# Patient Record
Sex: Female | Born: 1955 | Race: White | Hispanic: No | Marital: Married | Smoking: Former smoker
Health system: Southern US, Community
[De-identification: ages and names within clinical notes are randomized; demographics above are authoritative.]

## PROBLEM LIST (undated history)

## (undated) ENCOUNTER — Ambulatory Visit: Discharge status: Absent without leave | Payer: Medicare PPO

## (undated) DIAGNOSIS — R55 Syncope and collapse: Secondary | ICD-10-CM

## (undated) DIAGNOSIS — N6009 Solitary cyst of unspecified breast: Secondary | ICD-10-CM

## (undated) DIAGNOSIS — E079 Disorder of thyroid, unspecified: Secondary | ICD-10-CM

## (undated) HISTORY — PX: ABDOMINAL HYSTERECTOMY: SHX81

## (undated) HISTORY — PX: TUBAL LIGATION: SHX77

## (undated) HISTORY — DX: Syncope and collapse: R55

## (undated) HISTORY — PX: ETHMOIDECTOMY: SHX5197

## (undated) HISTORY — DX: Solitary cyst of unspecified breast: N60.09

## (undated) HISTORY — DX: Disorder of thyroid, unspecified: E07.9

## (undated) HISTORY — PX: WISDOM TOOTH EXTRACTION: SHX21

## (undated) HISTORY — PX: HAND SURGERY: SHX662

---
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
## 1999-08-19 ENCOUNTER — Other Ambulatory Visit: Admission: RE | Admit: 1999-08-19 | Discharge: 1999-08-19 | Payer: Self-pay | Admitting: Gynecology

## 2000-08-22 ENCOUNTER — Other Ambulatory Visit: Admission: RE | Admit: 2000-08-22 | Discharge: 2000-08-22 | Payer: Self-pay | Admitting: Gynecology

## 2000-08-25 ENCOUNTER — Encounter: Admission: RE | Admit: 2000-08-25 | Discharge: 2000-08-25 | Payer: Self-pay | Admitting: Gynecology

## 2000-08-25 ENCOUNTER — Encounter: Payer: Self-pay | Admitting: Gynecology

## 2000-09-01 ENCOUNTER — Encounter: Admission: RE | Admit: 2000-09-01 | Discharge: 2000-09-01 | Payer: Self-pay | Admitting: Gynecology

## 2000-09-01 ENCOUNTER — Encounter: Payer: Self-pay | Admitting: Gynecology

## 2001-02-15 ENCOUNTER — Ambulatory Visit (HOSPITAL_COMMUNITY): Admission: RE | Admit: 2001-02-15 | Discharge: 2001-02-15 | Payer: Self-pay | Admitting: Gastroenterology

## 2001-08-23 ENCOUNTER — Other Ambulatory Visit: Admission: RE | Admit: 2001-08-23 | Discharge: 2001-08-23 | Payer: Self-pay | Admitting: Gynecology

## 2001-09-07 ENCOUNTER — Encounter: Payer: Self-pay | Admitting: Gynecology

## 2001-09-07 ENCOUNTER — Encounter: Admission: RE | Admit: 2001-09-07 | Discharge: 2001-09-07 | Payer: Self-pay | Admitting: Gynecology

## 2002-06-14 ENCOUNTER — Encounter: Payer: Self-pay | Admitting: Family Medicine

## 2002-06-14 ENCOUNTER — Ambulatory Visit (HOSPITAL_COMMUNITY): Admission: RE | Admit: 2002-06-14 | Discharge: 2002-06-14 | Payer: Self-pay | Admitting: Family Medicine

## 2002-06-19 ENCOUNTER — Ambulatory Visit (HOSPITAL_COMMUNITY): Admission: RE | Admit: 2002-06-19 | Discharge: 2002-06-19 | Payer: Self-pay | Admitting: Family Medicine

## 2002-06-19 ENCOUNTER — Encounter: Payer: Self-pay | Admitting: Family Medicine

## 2002-09-09 ENCOUNTER — Encounter: Payer: Self-pay | Admitting: Gynecology

## 2002-09-09 ENCOUNTER — Encounter: Admission: RE | Admit: 2002-09-09 | Discharge: 2002-09-09 | Payer: Self-pay | Admitting: Gynecology

## 2002-11-04 ENCOUNTER — Ambulatory Visit (HOSPITAL_COMMUNITY): Admission: RE | Admit: 2002-11-04 | Discharge: 2002-11-04 | Payer: Self-pay | Admitting: Family Medicine

## 2002-11-04 ENCOUNTER — Encounter: Payer: Self-pay | Admitting: Family Medicine

## 2003-02-26 ENCOUNTER — Other Ambulatory Visit: Admission: RE | Admit: 2003-02-26 | Discharge: 2003-02-26 | Payer: Self-pay | Admitting: Gynecology

## 2004-03-26 ENCOUNTER — Ambulatory Visit (HOSPITAL_COMMUNITY): Admission: RE | Admit: 2004-03-26 | Discharge: 2004-03-26 | Payer: Self-pay | Admitting: Orthopedic Surgery

## 2004-03-26 ENCOUNTER — Encounter: Payer: Self-pay | Admitting: Cardiology

## 2004-06-17 ENCOUNTER — Other Ambulatory Visit: Admission: RE | Admit: 2004-06-17 | Discharge: 2004-06-17 | Payer: Self-pay | Admitting: Gynecology

## 2004-06-20 ENCOUNTER — Emergency Department (HOSPITAL_COMMUNITY): Admission: EM | Admit: 2004-06-20 | Discharge: 2004-06-20 | Payer: Self-pay | Admitting: *Deleted

## 2004-11-01 ENCOUNTER — Encounter: Admission: RE | Admit: 2004-11-01 | Discharge: 2004-11-01 | Payer: Self-pay | Admitting: Gynecology

## 2005-03-01 ENCOUNTER — Encounter: Admission: RE | Admit: 2005-03-01 | Discharge: 2005-03-01 | Payer: Self-pay | Admitting: Gynecology

## 2005-12-08 ENCOUNTER — Other Ambulatory Visit: Admission: RE | Admit: 2005-12-08 | Discharge: 2005-12-08 | Payer: Self-pay | Admitting: Gynecology

## 2006-05-03 ENCOUNTER — Encounter: Admission: RE | Admit: 2006-05-03 | Discharge: 2006-05-03 | Payer: Self-pay | Admitting: Gynecology

## 2007-05-07 ENCOUNTER — Other Ambulatory Visit: Admission: RE | Admit: 2007-05-07 | Discharge: 2007-05-07 | Payer: Self-pay | Admitting: Gynecology

## 2008-02-26 ENCOUNTER — Encounter: Admission: RE | Admit: 2008-02-26 | Discharge: 2008-02-26 | Payer: Self-pay | Admitting: Gynecology

## 2011-04-28 ENCOUNTER — Other Ambulatory Visit: Payer: Self-pay | Admitting: Gastroenterology

## 2011-04-28 ENCOUNTER — Ambulatory Visit (HOSPITAL_COMMUNITY)
Admission: RE | Admit: 2011-04-28 | Discharge: 2011-04-28 | Disposition: A | Discharge status: Home / Self Care | Payer: BC Managed Care – PPO | Source: Ambulatory Visit | Attending: Gastroenterology | Admitting: Gastroenterology

## 2011-04-28 DIAGNOSIS — IMO0001 Reserved for inherently not codable concepts without codable children: Secondary | ICD-10-CM | POA: Insufficient documentation

## 2011-04-28 DIAGNOSIS — I059 Rheumatic mitral valve disease, unspecified: Secondary | ICD-10-CM | POA: Insufficient documentation

## 2011-04-28 DIAGNOSIS — Z90710 Acquired absence of both cervix and uterus: Secondary | ICD-10-CM | POA: Insufficient documentation

## 2011-04-28 DIAGNOSIS — K219 Gastro-esophageal reflux disease without esophagitis: Secondary | ICD-10-CM | POA: Insufficient documentation

## 2011-04-28 DIAGNOSIS — K589 Irritable bowel syndrome without diarrhea: Secondary | ICD-10-CM | POA: Insufficient documentation

## 2011-04-28 DIAGNOSIS — Z1211 Encounter for screening for malignant neoplasm of colon: Secondary | ICD-10-CM | POA: Insufficient documentation

## 2011-04-28 DIAGNOSIS — Z79899 Other long term (current) drug therapy: Secondary | ICD-10-CM | POA: Insufficient documentation

## 2011-04-28 DIAGNOSIS — Z800 Family history of malignant neoplasm of digestive organs: Secondary | ICD-10-CM | POA: Insufficient documentation

## 2011-04-28 DIAGNOSIS — E039 Hypothyroidism, unspecified: Secondary | ICD-10-CM | POA: Insufficient documentation

## 2011-05-09 NOTE — Op Note (Signed)
  NAME:  SHANTERICA, BIEHLER NO.:  [id number]  MEDICAL RECORD NO.:  [id number]  LOCATION:  W[facility]                         FACILITY:  [facility]  PHYSICIAN:  Danise Edge, M.D.   DATE OF BIRTH:  1956/04/29  DATE OF PROCEDURE:  04/28/2011 DATE OF DISCHARGE:                              OPERATIVE REPORT   PROCEDURE:  Screening colonoscopy.  REFERRING PHYSICIAN:  Dr. Quentin Cornwall.  HISTORY:  Ms. Carrieanne Kleen is a 55 year old female whose father was diagnosed with colon cancer in his 76s.  The patient underwent normal screening colonoscopies in 2002 and in 2007.  ENDOSCOPIST:  Danise Edge, M.D.  PREMEDICATIONS:  Propofol administered by Anesthesia.  PROCEDURE:  Anal inspection and digital rectal exam were normal.  The Pentax pediatric colonoscope was introduced into the rectum and advanced to the cecum.  A normal-appearing ileocecal valve and appendiceal orifice were identified.  Advancement of the colonoscope was technically difficult due to colonic loop formation.  Colonic preparation for the exam today was good.  Rectum normal.  Retroflex view of the distal rectum normal.  Sigmoid colon and descending colon.  From the proximal descending colon, a 3-mm sessile polyp was removed with cold biopsy forceps.  Splenic flexure normal.  Transverse colon normal.  Hepatic flexure normal.  Ascending colon normal.  Cecum and ileocecal valve normal.  ASSESSMENT:  A diminutive polyp was removed from the proximal descending colon; otherwise, normal screening proctocolonoscopy to the cecum.  RECOMMENDATIONS:  Repeat colonoscopy in 5 years.          ______________________________ Danise Edge, M.D.     MJ/MEDQ  D:  04/28/2011  T:  04/28/2011  Job:  [id number]  Electronically Signed by Danise Edge M.D. on 05/09/2011 04:15:42 PM

## 2012-02-28 ENCOUNTER — Encounter: Payer: Self-pay | Admitting: Family Medicine

## 2012-02-28 ENCOUNTER — Ambulatory Visit (INDEPENDENT_AMBULATORY_CARE_PROVIDER_SITE_OTHER): Payer: BC Managed Care – PPO | Admitting: Family Medicine

## 2012-02-28 VITALS — BP 124/76 | HR 99 | Resp 18 | Ht 65.0 in | Wt 118.0 lb

## 2012-02-28 DIAGNOSIS — R102 Pelvic and perineal pain: Secondary | ICD-10-CM | POA: Insufficient documentation

## 2012-02-28 DIAGNOSIS — E039 Hypothyroidism, unspecified: Secondary | ICD-10-CM

## 2012-02-28 DIAGNOSIS — E785 Hyperlipidemia, unspecified: Secondary | ICD-10-CM | POA: Insufficient documentation

## 2012-02-28 DIAGNOSIS — R109 Unspecified abdominal pain: Secondary | ICD-10-CM

## 2012-02-28 DIAGNOSIS — R634 Abnormal weight loss: Secondary | ICD-10-CM

## 2012-02-28 LAB — POCT URINALYSIS DIPSTICK
Bilirubin, UA: NEGATIVE
Blood, UA: NEGATIVE
Glucose, UA: NEGATIVE
Ketones, UA: NEGATIVE
Leukocytes, UA: NEGATIVE
Nitrite, UA: NEGATIVE
Protein, UA: NEGATIVE
Spec Grav, UA: 1.01
Urobilinogen, UA: 0.2
pH, UA: 5.5

## 2012-02-28 NOTE — Patient Instructions (Signed)
Please get Mammogram done I will get records from Dr. Nicholas Lose , Dr.Urmos and Dr. Laural Benes Get the blood work done, we will call if your medication needs to be changed Continue current medications We will call with an appointment for the thyroid ultrasound F/U 2 months for weight

## 2012-02-28 NOTE — Progress Notes (Signed)
  Subjective:    Patient ID: Kathleen Reed, female    DOB: [birth year], 56 y.o.   MRN: [id number]  HPI Pt here to establish care, previous PCP Dr. Thermon Leyland [place] [facility]- Dr. Danise Edge, [place] [facility] GYN- Dr. Otilio Miu thyroid disease- on replacement for many years, previously on synthroid but this did not work for her, caused muscle aches and weight gain. Now on armour thyroid, last set of labs in Sept showed TSH 0.005, T3, 41, T4 8.4  Lipid panel normal- TC 186, LDL 99- Sept 2012  Weight loss- her family has been concerned about her weight loss, she has unintentionally lost 20lbs in approx 1 year. 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 She feels well except occ nausea that comes and goes, has good appetite, normal bowels  Due for Mammogram Colonoscopy every 5 years, father with Colon cancer history Continues to have PAP Smears s/p hysterectomy for abnormal cells?   Review of Systems   GEN- denies fatigue, fever, weight loss,weakness, recent illness HEENT- denies eye drainage, change in vision, nasal discharge, CVS- denies chest pain, palpitations RESP- denies SOB, cough, wheeze ABD- occ N/ no emesis, change in stools, abd pain GU- denies dysuria, hematuria, dribbling, incontinence, no vag discharge, no bleeding MSK- denies joint pain, muscle aches, injury Neuro- denies headache, dizziness, syncope, seizure activity      Objective:   Physical Exam GEN- NAD, alert and oriented x3, thin female HEENT- PERRL, EOMI, non injected sclera, pink conjunctiva, MMM, oropharynx clear Neck- Supple, +thyromegaly, no nodule felt CVS- RRR, no murmur RESP-CTAB ABD-NABS,soft, mild TTP suprapubic region, no rebound, no guarding, no masses felt EXT- No edema Pulses- Radial, DP- 2+        Assessment & Plan:

## 2012-02-28 NOTE — Assessment & Plan Note (Signed)
Most recent check normal , diet controlled

## 2012-02-28 NOTE — Assessment & Plan Note (Addendum)
Concerned for possible enlarged thyroid, obtain ultrasound Check labs   Repeat labs, pt overtreated with meds, will decrease armor thyroid to 2 tabs daily.

## 2012-02-28 NOTE — Assessment & Plan Note (Signed)
Will start work-up for weight loss Obtain records from previous physicians Pt due for repeat PAP  Send for Mammogram Recheck labs and thyroid studies Other differential this may be normal weight loss from increased activity but will process with above

## 2012-02-28 NOTE — Assessment & Plan Note (Signed)
Found on exam, very mild UA normal, no red flags

## 2012-02-29 LAB — T3, FREE: T3, Free: 6.6 pg/mL — ABNORMAL HIGH (ref 2.3–4.2)

## 2012-02-29 LAB — CBC WITH DIFFERENTIAL/PLATELET
Basophils Absolute: 0 10*3/uL (ref 0.0–0.1)
Basophils Relative: 0 % (ref 0–1)
Eosinophils Absolute: 0.1 10*3/uL (ref 0.0–0.7)
Eosinophils Relative: 2 % (ref 0–5)
HCT: 40.3 % (ref 36.0–46.0)
Hemoglobin: 13 g/dL (ref 12.0–15.0)
Lymphocytes Relative: 38 % (ref 12–46)
Lymphs Abs: 2.8 10*3/uL (ref 0.7–4.0)
MCH: 30 pg (ref 26.0–34.0)
MCHC: 32.3 g/dL (ref 30.0–36.0)
MCV: 93.1 fL (ref 78.0–100.0)
Monocytes Absolute: 0.5 10*3/uL (ref 0.1–1.0)
Monocytes Relative: 6 % (ref 3–12)
Neutro Abs: 4 10*3/uL (ref 1.7–7.7)
Neutrophils Relative %: 54 % (ref 43–77)
Platelets: 232 10*3/uL (ref 150–400)
RBC: 4.33 MIL/uL (ref 3.87–5.11)
RDW: 13 % (ref 11.5–15.5)
WBC: 7.4 10*3/uL (ref 4.0–10.5)

## 2012-02-29 LAB — COMPREHENSIVE METABOLIC PANEL
ALT: 20 U/L (ref 0–35)
AST: 21 U/L (ref 0–37)
Albumin: 4.2 g/dL (ref 3.5–5.2)
Alkaline Phosphatase: 75 U/L (ref 39–117)
BUN: 17 mg/dL (ref 6–23)
CO2: 29 mEq/L (ref 19–32)
Calcium: 9.5 mg/dL (ref 8.4–10.5)
Chloride: 105 mEq/L (ref 96–112)
Creat: 0.69 mg/dL (ref 0.50–1.10)
Glucose, Bld: 77 mg/dL (ref 70–99)
Potassium: 4.2 mEq/L (ref 3.5–5.3)
Sodium: 142 mEq/L (ref 135–145)
Total Bilirubin: 0.2 mg/dL — ABNORMAL LOW (ref 0.3–1.2)
Total Protein: 6.6 g/dL (ref 6.0–8.3)

## 2012-02-29 LAB — T4: T4, Total: 7.5 ug/dL (ref 5.0–12.5)

## 2012-02-29 LAB — TSH: TSH: <0.008 u[IU]/mL — ABNORMAL LOW (ref 0.350–4.500)

## 2012-02-29 NOTE — Progress Notes (Signed)
Addended by: Milinda Antis F on: 02/29/2012 08:02 AM   Modules accepted: Orders

## 2012-03-02 ENCOUNTER — Telehealth: Payer: Self-pay | Admitting: Family Medicine

## 2012-03-02 ENCOUNTER — Ambulatory Visit (HOSPITAL_COMMUNITY)
Admission: RE | Admit: 2012-03-02 | Discharge: 2012-03-02 | Disposition: A | Discharge status: Home / Self Care | Payer: BC Managed Care – PPO | Source: Ambulatory Visit | Attending: Family Medicine | Admitting: Family Medicine

## 2012-03-02 DIAGNOSIS — E049 Nontoxic goiter, unspecified: Secondary | ICD-10-CM | POA: Insufficient documentation

## 2012-03-02 DIAGNOSIS — E039 Hypothyroidism, unspecified: Secondary | ICD-10-CM

## 2012-03-02 NOTE — Telephone Encounter (Signed)
I spoke with pt, she just had ultrasound done, she was a little upset that her abnormal labs were not caught back in sept. She feels well otherwise. She will set up her own Mammogram at the breast center in [place] She will call if she has any problems with decreasing her thyroid medication

## 2012-03-05 ENCOUNTER — Telehealth: Payer: Self-pay | Admitting: Family Medicine

## 2012-03-05 DIAGNOSIS — E041 Nontoxic single thyroid nodule: Secondary | ICD-10-CM

## 2012-03-05 DIAGNOSIS — R599 Enlarged lymph nodes, unspecified: Secondary | ICD-10-CM

## 2012-03-05 DIAGNOSIS — R634 Abnormal weight loss: Secondary | ICD-10-CM

## 2012-03-05 NOTE — Telephone Encounter (Signed)
Pt given results, I will order a CT of neck with contrast to look further at nodes and nodule. She voiced understanding

## 2012-03-12 ENCOUNTER — Other Ambulatory Visit: Payer: Self-pay | Admitting: Gynecology

## 2012-03-12 DIAGNOSIS — N63 Unspecified lump in unspecified breast: Secondary | ICD-10-CM

## 2012-03-13 ENCOUNTER — Encounter (HOSPITAL_COMMUNITY): Payer: Self-pay

## 2012-03-13 ENCOUNTER — Ambulatory Visit (HOSPITAL_COMMUNITY)
Admission: RE | Admit: 2012-03-13 | Discharge: 2012-03-13 | Disposition: A | Discharge status: Home / Self Care | Payer: BC Managed Care – PPO | Source: Ambulatory Visit | Attending: Family Medicine | Admitting: Family Medicine

## 2012-03-13 DIAGNOSIS — E049 Nontoxic goiter, unspecified: Secondary | ICD-10-CM | POA: Insufficient documentation

## 2012-03-13 DIAGNOSIS — R634 Abnormal weight loss: Secondary | ICD-10-CM

## 2012-03-13 DIAGNOSIS — E041 Nontoxic single thyroid nodule: Secondary | ICD-10-CM

## 2012-03-13 DIAGNOSIS — R599 Enlarged lymph nodes, unspecified: Secondary | ICD-10-CM | POA: Insufficient documentation

## 2012-03-13 MED ORDER — IOHEXOL 300 MG/ML  SOLN
75.0000 mL | Freq: Once | INTRAMUSCULAR | Status: AC | PRN
  Administered 2012-03-13: 75 mL via INTRAVENOUS

## 2012-03-14 ENCOUNTER — Other Ambulatory Visit: Payer: Self-pay | Admitting: Gynecology

## 2012-03-14 ENCOUNTER — Ambulatory Visit
Admission: RE | Admit: 2012-03-14 | Discharge: 2012-03-14 | Disposition: A | Discharge status: Home / Self Care | Payer: BC Managed Care – PPO | Source: Ambulatory Visit | Attending: Gynecology | Admitting: Gynecology

## 2012-03-14 DIAGNOSIS — N63 Unspecified lump in unspecified breast: Secondary | ICD-10-CM

## 2012-03-16 ENCOUNTER — Telehealth: Payer: Self-pay | Admitting: Family Medicine

## 2012-03-16 NOTE — Telephone Encounter (Signed)
Pt aware.

## 2012-04-02 ENCOUNTER — Telehealth: Payer: Self-pay | Admitting: Family Medicine

## 2012-04-02 MED ORDER — THYROID 60 MG PO TABS: 60.0000 mg | ORAL_TABLET | Freq: Two times a day (BID) | ORAL | Status: DC

## 2012-04-02 NOTE — Telephone Encounter (Signed)
Refilled as requested  

## 2012-05-01 ENCOUNTER — Encounter: Payer: Self-pay | Admitting: Family Medicine

## 2012-05-01 ENCOUNTER — Ambulatory Visit (INDEPENDENT_AMBULATORY_CARE_PROVIDER_SITE_OTHER): Payer: BC Managed Care – PPO | Admitting: Family Medicine

## 2012-05-01 VITALS — BP 120/78 | HR 86 | Resp 15 | Ht 65.0 in | Wt 119.4 lb

## 2012-05-01 DIAGNOSIS — R634 Abnormal weight loss: Secondary | ICD-10-CM

## 2012-05-01 DIAGNOSIS — E039 Hypothyroidism, unspecified: Secondary | ICD-10-CM

## 2012-05-01 DIAGNOSIS — E041 Nontoxic single thyroid nodule: Secondary | ICD-10-CM

## 2012-05-01 NOTE — Assessment & Plan Note (Signed)
Left thyroid nodule repeat ultrasound in August of 2013

## 2012-05-01 NOTE — Assessment & Plan Note (Signed)
Will repeat thyroid studies today. If I find that her levels are difficult to control I will refer her to an alternative medicine physician he deals with natural by work replacement such as Armour Thyroid. She is in agreement with this plan.

## 2012-05-01 NOTE — Patient Instructions (Signed)
Please call your insurance about the shingles vaccine Get the thyroid studies done we will call with results Repeat thyroid ultrasound will be needed in August, 2013 F/U 5 months

## 2012-05-01 NOTE — Progress Notes (Signed)
  Subjective:    Patient ID: Kathleen Reed, female    DOB: Nov 08, 1956, 56 y.o.   MRN: [id number]  HPI  Patient here to followup interim visit for thyroid. Her thigh wart was decreased to 60 mg twice a day secondary to almost undetectable TSH as well as elevated free T3. A CT scan of the neck was done to evaluate thyroid and concern for abnormal lymphoid tissue these were benign but she does have a thyroid nodule that will need to be reevaluated in 6 months from initial scan. She's been doing well states she occasionally has some fogginess which were her initial symptoms when she was diagnosed with hypothyroidism. She has gained 1 pound since her last visit. She was seen by her GYN and mammogram was performed which showed no change in her benign cyst. She is also on estrogen replacement twice a week.  Review of Systems GEN- denies fatigue, fever, weight loss,weakness, recent illness HEENT- denies eye drainage, change in vision, nasal discharge, CVS- denies chest pain, palpitations RESP- denies SOB, cough, wheeze ABD- denies N/V, change in stools, abd pain GU- denies dysuria, hematuria, dribbling, incontinence MSK- denies joint pain, muscle aches, injury Neuro- denies headache, dizziness, syncope, seizure activity      Objective:   Physical Exam GEN- NAD, alert and oriented x3 HEENT- PERRL, EOMI, non injected sclera, pink conjunctiva, MMM, oropharynx clear Neck- Supple, prominent thyroid CVS- RRR, no murmur RESP-CTAB EXT- No edema Pulses- Radial, DP- 2+ Psych- a little anxious appearing       Assessment & Plan:

## 2012-05-01 NOTE — Assessment & Plan Note (Signed)
No true change in weight. She is gaining one parents. Negative workup thus far for other causes of malignancy.

## 2012-05-02 LAB — T4, FREE: Free T4: 1.26 ng/dL (ref 0.80–1.80)

## 2012-05-02 LAB — T3, FREE: T3, Free: 5.1 pg/mL — ABNORMAL HIGH (ref 2.3–4.2)

## 2012-05-02 LAB — TSH: TSH: 0.013 u[IU]/mL — ABNORMAL LOW (ref 0.350–4.500)

## 2012-05-04 ENCOUNTER — Telehealth: Payer: Self-pay | Admitting: Family Medicine

## 2012-05-04 DIAGNOSIS — E039 Hypothyroidism, unspecified: Secondary | ICD-10-CM

## 2012-05-04 NOTE — Telephone Encounter (Signed)
Pt given lab results, she is still not at correct dose, I do not feel comftable with this medication, will refer her to endocrine in [place] for care, I voiced this to her and she agrees with referral

## 2012-05-22 ENCOUNTER — Ambulatory Visit: Payer: BC Managed Care – PPO | Admitting: Family Medicine

## 2012-07-02 ENCOUNTER — Ambulatory Visit (HOSPITAL_COMMUNITY): Payer: BC Managed Care – PPO

## 2012-07-27 ENCOUNTER — Telehealth: Payer: Self-pay | Admitting: Family Medicine

## 2012-07-30 ENCOUNTER — Other Ambulatory Visit (HOSPITAL_COMMUNITY): Payer: BC Managed Care – PPO

## 2012-08-01 ENCOUNTER — Ambulatory Visit (HOSPITAL_COMMUNITY)
Admission: RE | Admit: 2012-08-01 | Discharge: 2012-08-01 | Disposition: A | Discharge status: Home / Self Care | Payer: BC Managed Care – PPO | Source: Ambulatory Visit | Attending: Family Medicine | Admitting: Family Medicine

## 2012-08-01 DIAGNOSIS — E039 Hypothyroidism, unspecified: Secondary | ICD-10-CM | POA: Insufficient documentation

## 2012-08-01 DIAGNOSIS — E041 Nontoxic single thyroid nodule: Secondary | ICD-10-CM

## 2012-08-01 DIAGNOSIS — E049 Nontoxic goiter, unspecified: Secondary | ICD-10-CM | POA: Insufficient documentation

## 2012-10-01 ENCOUNTER — Ambulatory Visit (INDEPENDENT_AMBULATORY_CARE_PROVIDER_SITE_OTHER): Payer: BC Managed Care – PPO | Admitting: Family Medicine

## 2012-10-01 ENCOUNTER — Encounter: Payer: Self-pay | Admitting: Family Medicine

## 2012-10-01 VITALS — BP 120/84 | HR 62 | Resp 15 | Ht 65.0 in | Wt 111.0 lb

## 2012-10-01 DIAGNOSIS — R109 Unspecified abdominal pain: Secondary | ICD-10-CM

## 2012-10-01 DIAGNOSIS — E039 Hypothyroidism, unspecified: Secondary | ICD-10-CM

## 2012-10-01 LAB — POCT URINALYSIS DIPSTICK
Bilirubin, UA: NEGATIVE
Blood, UA: NEGATIVE
Glucose, UA: NEGATIVE
Ketones, UA: NEGATIVE
Leukocytes, UA: NEGATIVE
Nitrite, UA: NEGATIVE
Protein, UA: NEGATIVE
Spec Grav, UA: 1.02
Urobilinogen, UA: 0.2
pH, UA: 7

## 2012-10-01 NOTE — Patient Instructions (Addendum)
Get the labs drawn Try tums for gas or zantac for acid reflux Stop the pepto bismol  Call if you do not improve and imaging will be done  Call if any symptoms change  F/U 6 months

## 2012-10-02 DIAGNOSIS — R109 Unspecified abdominal pain: Secondary | ICD-10-CM | POA: Insufficient documentation

## 2012-10-02 LAB — CBC WITH DIFFERENTIAL/PLATELET
Basophils Absolute: 0 10*3/uL (ref 0.0–0.1)
Basophils Relative: 0 % (ref 0–1)
Eosinophils Absolute: 0.1 10*3/uL (ref 0.0–0.7)
Eosinophils Relative: 1 % (ref 0–5)
HCT: 40.9 % (ref 36.0–46.0)
Hemoglobin: 14 g/dL (ref 12.0–15.0)
Lymphocytes Relative: 30 % (ref 12–46)
Lymphs Abs: 2.1 10*3/uL (ref 0.7–4.0)
MCH: 32 pg (ref 26.0–34.0)
MCHC: 34.2 g/dL (ref 30.0–36.0)
MCV: 93.4 fL (ref 78.0–100.0)
Monocytes Absolute: 0.5 10*3/uL (ref 0.1–1.0)
Monocytes Relative: 8 % (ref 3–12)
Neutro Abs: 4.3 10*3/uL (ref 1.7–7.7)
Neutrophils Relative %: 61 % (ref 43–77)
Platelets: 263 10*3/uL (ref 150–400)
RBC: 4.38 MIL/uL (ref 3.87–5.11)
RDW: 13.4 % (ref 11.5–15.5)
WBC: 7 10*3/uL (ref 4.0–10.5)

## 2012-10-02 LAB — LIPASE: Lipase: 44 U/L (ref 0–75)

## 2012-10-02 LAB — COMPREHENSIVE METABOLIC PANEL WITH GFR
ALT: 21 U/L (ref 0–35)
AST: 21 U/L (ref 0–37)
Albumin: 4.5 g/dL (ref 3.5–5.2)
Alkaline Phosphatase: 59 U/L (ref 39–117)
BUN: 12 mg/dL (ref 6–23)
CO2: 27 meq/L (ref 19–32)
Calcium: 9.4 mg/dL (ref 8.4–10.5)
Chloride: 103 meq/L (ref 96–112)
Creat: 0.8 mg/dL (ref 0.50–1.10)
Glucose, Bld: 78 mg/dL (ref 70–99)
Potassium: 4.5 meq/L (ref 3.5–5.3)
Sodium: 139 meq/L (ref 135–145)
Total Bilirubin: 0.3 mg/dL (ref 0.3–1.2)
Total Protein: 6.9 g/dL (ref 6.0–8.3)

## 2012-10-02 NOTE — Assessment & Plan Note (Signed)
Defer to endocrine, she feels better symptom wise with the increased dose she has changed herself to. Declines any other medications for her mood

## 2012-10-02 NOTE — Assessment & Plan Note (Addendum)
Unclear cause of abd pain, interesting that antiacid helps based on location. CBC and CMET wnl, UA neg, discussed getting imaging with pt, she opted to wait and see if it improved Advised to stop Pepto as she was taking good amounts and try TUMS/ or H2 blocker, plenty of fluids and keep BM regular

## 2012-10-02 NOTE — Progress Notes (Signed)
  Subjective:    Patient ID: Kathleen Reed, female    DOB: [birth year], 56 y.o.   MRN: [id number]  HPI Patient presents to follow chronic medical problems. She's been followed by endocrinology for her hypothyroidism she is on Armour Thyroid they have had a lot of difficulty getting her level straight now. She was dropped to low and has been feeling very deficient hormone Jeanella Craze is a she is extremely fatigued, feels very depressed and not like herself and her hormone therapy is off. She will recently increased her thyroid medication back to 90 mg which she states she has done well at this dose. She rbc's a change in her mood. For the past week she's had abdominal pain on and off. She describes it as a gaseous bloating feeling however will get sharp pain that makes her double over at times. No changes with food. Denies fever, change in bowels, dysuria, vaginal discharge. She does take Pepto-Bismol which helps also had a bowel movement helps however she denies constipation.   Review of Systems  GEN- +fatigue, fever, weight loss,weakness, recent illness HEENT- denies eye drainage, change in vision, nasal discharge, CVS- denies chest pain, palpitations RESP- denies SOB, cough, wheeze ABD- denies N/V, change in stools,+ abd pain GU- denies dysuria, hematuria, dribbling, incontinence MSK- denies joint pain, muscle aches, injury Neuro- denies headache, dizziness, syncope, seizure activity      Objective:   Physical Exam GEN- NAD, alert and oriented x3, fatigued appearing  HEENT- PERRL, EOMI, non injected sclera, pink conjunctiva, MMM, oropharynx clear Neck- Supple,  CVS- RRR, no murmur RESP-CTAB ABS-NABS,soft,TTP lower quadrants, no rebound, no guarding, mild suprapubic tenderness, no CVA tenderness EXT- No edema Pulses- Radial 2+        Assessment & Plan:    Biest estrogen compound

## 2013-04-01 ENCOUNTER — Ambulatory Visit: Payer: BC Managed Care – PPO | Admitting: Family Medicine

## 2014-04-16 NOTE — Telephone Encounter (Signed)
noted 

## 2015-01-27 ENCOUNTER — Other Ambulatory Visit: Payer: Self-pay

## 2015-01-27 DIAGNOSIS — Z1231 Encounter for screening mammogram for malignant neoplasm of breast: Secondary | ICD-10-CM

## 2015-02-02 ENCOUNTER — Encounter (INDEPENDENT_AMBULATORY_CARE_PROVIDER_SITE_OTHER): Payer: Self-pay

## 2015-02-02 ENCOUNTER — Ambulatory Visit
Admission: RE | Admit: 2015-02-02 | Discharge: 2015-02-02 | Disposition: A | Discharge status: Home / Self Care | Payer: BC Managed Care – PPO | Source: Ambulatory Visit

## 2015-02-02 DIAGNOSIS — Z1231 Encounter for screening mammogram for malignant neoplasm of breast: Secondary | ICD-10-CM

## 2016-06-06 ENCOUNTER — Other Ambulatory Visit: Payer: Self-pay | Admitting: Internal Medicine

## 2016-06-06 DIAGNOSIS — Z1231 Encounter for screening mammogram for malignant neoplasm of breast: Secondary | ICD-10-CM

## 2016-06-07 ENCOUNTER — Ambulatory Visit
Admission: RE | Admit: 2016-06-07 | Discharge: 2016-06-07 | Disposition: A | Discharge status: Home / Self Care | Payer: BC Managed Care – PPO | Source: Ambulatory Visit | Attending: Internal Medicine | Admitting: Internal Medicine

## 2016-06-07 DIAGNOSIS — Z1231 Encounter for screening mammogram for malignant neoplasm of breast: Secondary | ICD-10-CM

## 2016-12-17 ENCOUNTER — Encounter (HOSPITAL_COMMUNITY): Payer: Self-pay | Admitting: Family Medicine

## 2016-12-17 ENCOUNTER — Ambulatory Visit (HOSPITAL_COMMUNITY)
Admission: EM | Admit: 2016-12-17 | Discharge: 2016-12-17 | Disposition: A | Discharge status: Home / Self Care | Payer: BC Managed Care – PPO | Attending: Family Medicine | Admitting: Family Medicine

## 2016-12-17 DIAGNOSIS — J0100 Acute maxillary sinusitis, unspecified: Secondary | ICD-10-CM

## 2016-12-17 MED ORDER — AZITHROMYCIN 250 MG PO TABS: 250.0000 mg | ORAL_TABLET | Freq: Every day | ORAL | 0 refills | Status: DC

## 2016-12-17 MED ORDER — AZITHROMYCIN 250 MG PO TABS: 250.0000 mg | ORAL_TABLET | Freq: Every day | ORAL | 0 refills | Status: AC

## 2016-12-17 NOTE — ED Triage Notes (Signed)
Pt here for sinus congestion x 2 weeks. sts started with tooth pain and ear pain without congestion and then went to congestion and facial pain.

## 2016-12-17 NOTE — ED Provider Notes (Signed)
CSN: [id number]     Arrival date & time 12/17/16  1549 History   First MD Initiated Contact with Patient 12/17/16 1738     Chief Complaint  Patient presents with  . Facial Pain   (Consider location/radiation/quality/duration/timing/severity/associated sxs/prior Treatment) Patient is here for possible sinus infection for 2 weeks. Pain started of as a toothache, which her dentist didn't find any abnormality on her teeth and her dentist suggested that it could be referred pain from her sinus. Pain then progressed to earache and now to bilateral maxillary sinus pain accompany by nasal congestion. The sinus pain and nasal congestion started 3 days ago. Patient reports her symptoms are worsening. Denies any alleviating or aggravating factor. Denies any modifying factors.         Past Medical History:  Diagnosis Date  . Breast cyst   . Thyroid disease    Past Surgical History:  Procedure Laterality Date  . ABDOMINAL HYSTERECTOMY  1994 ?  . APPENDECTOMY  1971  . TONSILECTOMY, ADENOIDECTOMY, BILATERAL MYRINGOTOMY AND TUBES  1958  . TUBAL LIGATION  1980s   Family History  Problem Relation Age of Onset  . Hypertension Mother   . Hyperlipidemia Mother   . Osteoporosis Mother   . Cancer Mother     uterine  . Hyperlipidemia Father   . Hypertension Father   . Cancer Father     colon  . Cancer Sister     breast   . Diabetes Maternal Aunt   . Diabetes Maternal Uncle   . Diabetes Maternal Grandmother    Social History  Substance Use Topics  . Smoking status: Former Games developermoker  . Smokeless tobacco: Never Used  . Alcohol use No   OB History    No data available     Review of Systems  Constitutional: Negative for chills, fatigue and fever.  HENT: Positive for congestion, postnasal drip, sinus pain and sinus pressure. Negative for dental problem, ear pain, rhinorrhea, sneezing and sore throat.   Respiratory: Negative for cough and shortness of breath.   Cardiovascular: Negative for  chest pain.  Gastrointestinal: Positive for nausea. Negative for abdominal pain.  Skin: Negative for rash.  Neurological: Negative for dizziness and headaches.    Allergies  Amoxicillin and Penicillins  Home Medications   Prior to Admission medications   Medication Sig Start Date End Date Taking? Authorizing Provider  azithromycin (ZITHROMAX) 250 MG tablet Take 1 tablet (250 mg total) by mouth daily. Take first 2 tablets together, then 1 every day until finished. 12/17/16 12/22/16  Lucia EstelleFeng Trinidy Masterson, NP  azithromycin (ZITHROMAX) 250 MG tablet Take 1 tablet (250 mg total) by mouth daily. Take first 2 tablets together, then 1 every day until finished. 12/17/16   Lucia EstelleFeng Zorina Mallin, NP  Cholecalciferol (VITAMIN D3) 1000 UNITS CAPS Take by mouth.    Historical Provider, MD  co-enzyme Q-10 30 MG capsule Take 50 mg by mouth daily.     Historical Provider, MD  estradiol (MINIVELLE) 0.0375 MG/24HR Place 1 patch onto the skin 2 (two) times a week.    Historical Provider, MD  KRILL OIL 1000 MG CAPS Take by mouth daily.    Historical Provider, MD  Multiple Vitamin (MULTIVITAMIN) tablet Take 1 tablet by mouth daily.    Historical Provider, MD  thyroid (ARMOUR) 60 MG tablet One tab in the am and 1/2 in the pm 04/02/12   Salley ScarletKawanta F Antoine, MD   Meds Ordered and Administered this Visit  Medications - No data to display  BP 136/83   Pulse 71   Temp 98.2 F (36.8 C)   Resp 18   SpO2 98%  No data found.   Physical Exam  Constitutional: She is oriented to person, place, and time. She appears well-developed and well-nourished.  HENT:  Head: Normocephalic and atraumatic.  Right Ear: External ear normal.  Left Ear: External ear normal.  Nose: Nose normal.  Mouth/Throat: Oropharynx is clear and moist. No oropharyngeal exudate.  TM pearly gray bilaterally with no erythema. Tonsils are absent. Bilateral maxillary sinuses are very tender to percuss.  Eyes: Conjunctivae are normal. Pupils are equal, round, and reactive  to light.  Neck: Normal range of motion.  Cardiovascular: Normal rate, regular rhythm and normal heart sounds.   Pulmonary/Chest: Effort normal and breath sounds normal.  Abdominal: Soft. Bowel sounds are normal. She exhibits no distension. There is no tenderness.  Lymphadenopathy:    She has no cervical adenopathy.  Neurological: She is alert and oriented to person, place, and time.  Skin: Skin is warm and dry.  Nursing note and vitals reviewed.   Urgent Care Course     Procedures (including critical care time)  Labs Review Labs Reviewed - No data to display  Imaging Review No results found.  MDM   1. Acute non-recurrent maxillary sinusitis    Rx for z-pak send to Pharmacy. Reviewed directions for usage and side effects. Patient states understanding and will call with questions or problems. Patient instructed to call or follow up with his/her primary care doctor if failure to improve or change in symptoms. Discharge instruction given.     Lucia Estelle, NP 12/17/16 1756

## 2016-12-26 ENCOUNTER — Ambulatory Visit (INDEPENDENT_AMBULATORY_CARE_PROVIDER_SITE_OTHER): Payer: BC Managed Care – PPO

## 2016-12-26 ENCOUNTER — Ambulatory Visit (HOSPITAL_COMMUNITY)
Admission: EM | Admit: 2016-12-26 | Discharge: 2016-12-26 | Disposition: A | Discharge status: Home / Self Care | Payer: BC Managed Care – PPO | Attending: Internal Medicine | Admitting: Internal Medicine

## 2016-12-26 DIAGNOSIS — S62512A Displaced fracture of proximal phalanx of left thumb, initial encounter for closed fracture: Secondary | ICD-10-CM
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
## 2016-12-26 NOTE — Discharge Instructions (Signed)
Wear the splint at all times. May remove for bathing. Keep hand elevated to help the swelling. Call the hand surgeon tomorrow for an appointment.
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
## 2017-03-21 ENCOUNTER — Telehealth: Payer: Self-pay | Admitting: Cardiology

## 2017-03-21 NOTE — Telephone Encounter (Signed)
Received records from [facility] for appointment on 03/24/17 with Dr Duke Salvia.  Records put with Dr Leonides Sake schedule for 03/24/17. lp

## 2017-03-21 NOTE — Telephone Encounter (Signed)
Records received from [facility] are for appointment with Dr Antoine Poche (not Dr Duke Salvia) on 03/24/17. lp

## 2017-03-23 ENCOUNTER — Encounter: Payer: Self-pay | Admitting: Cardiology
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
## 2017-03-24 ENCOUNTER — Ambulatory Visit (INDEPENDENT_AMBULATORY_CARE_PROVIDER_SITE_OTHER): Payer: BC Managed Care – PPO | Admitting: Cardiology

## 2017-03-24 ENCOUNTER — Encounter: Payer: Self-pay | Admitting: Cardiology

## 2017-03-24 VITALS — BP 122/70 | HR 74 | Ht 65.0 in | Wt 146.0 lb

## 2017-03-24 DIAGNOSIS — R55 Syncope and collapse: Secondary | ICD-10-CM

## 2017-03-24 DIAGNOSIS — I447 Left bundle-branch block, unspecified: Secondary | ICD-10-CM

## 2017-03-24 NOTE — Patient Instructions (Signed)
Medication Instructions: No change    Procedures/Testing: Your physician has recommended that you wear an event monitor for 21 days. This will be placed at [facility], [address].  Event monitors are medical devices that record the heart's electrical activity. Doctors most often us these monitors to diagnose arrhythmias. Arrhythmias are problems with the speed or rhythm of the heartbeat. The monitor is a small, portable device. You can wear one while you do your normal daily activities. This is usually used to diagnose what is causing palpitations/syncope (passing out).  Your physician has requested that you have an echocardiogram. Echocardiography is a painless test that uses sound waves to create images of your heart. It provides your doctor with information about the size and shape of your heart and how well your heart's chambers and valves are working. This procedure takes approximately one hour. There are no restrictions for this procedure. This will be done at [address], [address].   Follow-Up: Your physician recommends that you schedule a follow-up appointment in: ONE MONTH WITH DR. HOCHREIN    If you need a refill on your cardiac medications before your next appointment, please call your pharmacy.

## 2017-03-26 DIAGNOSIS — R55 Syncope and collapse: Secondary | ICD-10-CM | POA: Insufficient documentation

## 2017-03-26 DIAGNOSIS — I447 Left bundle-branch block, unspecified: Secondary | ICD-10-CM | POA: Insufficient documentation

## 2017-04-11 ENCOUNTER — Ambulatory Visit (HOSPITAL_COMMUNITY): Payer: BC Managed Care – PPO | Attending: Cardiovascular Disease

## 2017-04-11 ENCOUNTER — Ambulatory Visit (INDEPENDENT_AMBULATORY_CARE_PROVIDER_SITE_OTHER): Payer: BC Managed Care – PPO

## 2017-04-11 ENCOUNTER — Other Ambulatory Visit: Payer: Self-pay

## 2017-04-11 DIAGNOSIS — I447 Left bundle-branch block, unspecified: Secondary | ICD-10-CM

## 2017-04-11 DIAGNOSIS — R55 Syncope and collapse: Secondary | ICD-10-CM | POA: Diagnosis present
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
## 2017-05-09 ENCOUNTER — Encounter: Payer: Self-pay | Admitting: Cardiology

## 2017-05-09 ENCOUNTER — Ambulatory Visit (INDEPENDENT_AMBULATORY_CARE_PROVIDER_SITE_OTHER): Payer: BC Managed Care – PPO | Admitting: Cardiology

## 2017-05-09 VITALS — BP 142/78 | HR 84 | Ht 65.0 in | Wt 145.0 lb

## 2017-05-09 DIAGNOSIS — R55 Syncope and collapse: Secondary | ICD-10-CM | POA: Diagnosis not present

## 2017-05-09 NOTE — Patient Instructions (Signed)
Medication Instructions:  Continue current medications  Labwork: None Ordered  Testing/Procedures: None Ordered  Follow-Up: You have been referred to Neurologist Dr Lesia SagoKeith Willis  Your physician wants you to follow-up in: 4 Months with Dr Antoine PocheHochrein. You will receive a reminder letter in the mail two months in advance. If you don't receive a letter, please call our office to schedule the follow-up appointment.    Any Other Special Instructions Will Be Listed Below (If Applicable).   If you need a refill on your cardiac medications before your next appointment, please call your pharmacy.

## 2017-05-10 ENCOUNTER — Encounter: Payer: Self-pay | Admitting: Cardiology

## 2017-07-12 ENCOUNTER — Encounter (INDEPENDENT_AMBULATORY_CARE_PROVIDER_SITE_OTHER): Payer: Self-pay

## 2017-07-12 ENCOUNTER — Encounter: Payer: Self-pay | Admitting: Neurology

## 2017-07-12 ENCOUNTER — Ambulatory Visit (INDEPENDENT_AMBULATORY_CARE_PROVIDER_SITE_OTHER): Payer: BC Managed Care – PPO | Admitting: Neurology

## 2017-07-12 VITALS — BP 137/77 | HR 71 | Ht 65.0 in | Wt 146.0 lb

## 2017-07-12 DIAGNOSIS — R55 Syncope and collapse: Secondary | ICD-10-CM | POA: Diagnosis not present
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
## 2017-07-12 NOTE — Patient Instructions (Signed)
   WE will get MRI of the brain and MRA of the head and neck.  We will check EEG.

## 2017-07-14 ENCOUNTER — Telehealth: Payer: Self-pay | Admitting: Neurology

## 2017-07-14 DIAGNOSIS — Z5181 Encounter for therapeutic drug level monitoring: Secondary | ICD-10-CM

## 2017-07-14 NOTE — Telephone Encounter (Signed)
Pt calling re: herki upcoming MRI, pt said it has been confirmed that her kidney function test has not been since Feb 2017, she was unsure if this will cause a delay in her MRI or if Dr Anne Hahn will want other testing done before the MRI.  Pt is asking to be called

## 2017-07-14 NOTE — Addendum Note (Signed)
Addended by: York Spaniel on: 07/14/2017 12:48 PM   Modules accepted: Orders

## 2017-07-14 NOTE — Telephone Encounter (Signed)
I called patient. She will come in for blood work to document kidney function.

## 2017-07-17 ENCOUNTER — Other Ambulatory Visit (INDEPENDENT_AMBULATORY_CARE_PROVIDER_SITE_OTHER): Payer: Self-pay

## 2017-07-17 DIAGNOSIS — Z0289 Encounter for other administrative examinations: Secondary | ICD-10-CM

## 2017-07-17 DIAGNOSIS — Z5181 Encounter for therapeutic drug level monitoring: Secondary | ICD-10-CM

## 2017-07-18 LAB — BASIC METABOLIC PANEL
BUN/Creatinine Ratio: 12 (ref 12–28)
BUN: 12 mg/dL (ref 8–27)
CO2: 24 mmol/L (ref 20–29)
Calcium: 9.4 mg/dL (ref 8.7–10.3)
Chloride: 104 mmol/L (ref 96–106)
Creatinine, Ser: 1.01 mg/dL — ABNORMAL HIGH (ref 0.57–1.00)
GFR calc Af Amer: 68 mL/min/{1.73_m2} (ref >59)
GFR calc non Af Amer: 59 mL/min/{1.73_m2} — ABNORMAL LOW (ref >59)
Glucose: 85 mg/dL (ref 65–99)
Potassium: 4.1 mmol/L (ref 3.5–5.2)
Sodium: 144 mmol/L (ref 134–144)

## 2017-07-19 ENCOUNTER — Ambulatory Visit (INDEPENDENT_AMBULATORY_CARE_PROVIDER_SITE_OTHER): Payer: BC Managed Care – PPO

## 2017-07-19 DIAGNOSIS — R55 Syncope and collapse: Secondary | ICD-10-CM | POA: Diagnosis not present

## 2017-07-19 MED ORDER — GADOPENTETATE DIMEGLUMINE 469.01 MG/ML IV SOLN
20.0000 mL | Freq: Once | INTRAVENOUS | Status: AC | PRN
  Administered 2017-07-19: 20 mL via INTRAVENOUS

## 2017-07-23 ENCOUNTER — Telehealth: Payer: Self-pay | Admitting: Neurology

## 2017-07-23 NOTE — Telephone Encounter (Signed)
I called patient. MRI of the brain was unremarkable, MRA of the head and neck does not show any flow reducing lesions that produce syncope.   MRA neck 07/19/17:  IMPRESSION:  Abnormal MRI of the neck showing mild plaque with less than 50% stenosis at the origin of the left internal carotid artery. Both vertebral arteries have antegrade flow.   MRA head 07/19/17:  IMPRESSION:  Unremarkable MRA of the brain showing no significant stenosis of the large and medium size intracranial vessels. 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

## 2017-07-25 ENCOUNTER — Telehealth: Payer: Self-pay | Admitting: Neurology

## 2017-07-25 NOTE — Telephone Encounter (Signed)
I have received recent blood work done through the primary care physician, the blood work was done 01/11/2016. BUN is 12, creatinine is 0.86.

## 2017-07-26 ENCOUNTER — Ambulatory Visit (INDEPENDENT_AMBULATORY_CARE_PROVIDER_SITE_OTHER): Payer: BC Managed Care – PPO | Admitting: Neurology

## 2017-07-26 ENCOUNTER — Telehealth: Payer: Self-pay | Admitting: Neurology

## 2017-07-26 DIAGNOSIS — R55 Syncope and collapse: Secondary | ICD-10-CM | POA: Diagnosis not present

## 2017-07-26 NOTE — Procedures (Signed)
    History:  Kathleen Reed is a 61 year old patient with a history of multiple episodes of syncope in the past. The patient has had 4 events between October 2017 and May 2018. Several events have occurred while attempting to lie down. She was able to prevent one episode of syncope by lying flat on the back. The patient is being evaluated for these events.   This is a routine EEG. No skull defects are noted. Medications include Armour Thyroid, vitamin D, estradiol, fish oil, magnesium oxide, Sudafed, and Zyrtec.   EEG classification: Normal awake and drowsy  Description of the recording: The background rhythms of this recording consists of a fairly well modulated medium amplitude alpha rhythm of 11 Hz that is reactive to eye opening and closure. As the record progresses, the patient appears to remain in the waking state throughout the recording. Photic stimulation was performed, resulting in a bilateral and symmetric photic driving response. Hyperventilation was also performed, resulting in a minimal buildup of the background rhythm activities without significant slowing seen. Toward the end of the recording, the patient enters the drowsy state with slight symmetric slowing seen. The patient never enters stage II sleep. At no time during the recording does there appear to be evidence of spike or spike wave discharges or evidence of focal slowing. EKG monitor shows no evidence of cardiac rhythm abnormalities with a heart rate of 66.  Impression: This is a normal EEG recording in the waking and drowsy state. No evidence of ictal or interictal discharges are seen.

## 2017-07-26 NOTE — Telephone Encounter (Signed)
I called patient. The EEG study was unremarkable, the patient had MRI of the brain that was normal, MRA of the head and neck was unrevealing. The etiology of the blackout episodes is unknown.

## 2017-08-15 ENCOUNTER — Ambulatory Visit (HOSPITAL_COMMUNITY)
Admission: EM | Admit: 2017-08-15 | Discharge: 2017-08-15 | Disposition: A | Discharge status: Home / Self Care | Payer: BC Managed Care – PPO | Attending: Internal Medicine | Admitting: Internal Medicine

## 2017-08-15 ENCOUNTER — Encounter (HOSPITAL_COMMUNITY): Payer: Self-pay | Admitting: Emergency Medicine

## 2017-08-15 DIAGNOSIS — R221 Localized swelling, mass and lump, neck: Secondary | ICD-10-CM

## 2017-08-15 DIAGNOSIS — R5383 Other fatigue: Secondary | ICD-10-CM

## 2017-08-15 NOTE — Discharge Instructions (Signed)
No alarming signs on your exam today. I have attached a few names of endocrinologist that you can follow up with. Monitor for any changes in symptoms, passing out, trouble swallowing, trouble breathing, swelling of the throat, go to the emergency department for further evaluation.
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
## 2017-08-15 NOTE — ED Triage Notes (Signed)
PT reports she noticed throat swelling 2 weeks ago. PT reports associated difficulty swallowing. No airway involvement. PT has longterm history of thyroid issues. PT reports increased fatigue as well.

## 2017-09-20 ENCOUNTER — Telehealth: Payer: Self-pay | Admitting: *Deleted

## 2017-09-20 NOTE — Telephone Encounter (Signed)
Patient called and states she was seen in the Urgent care and patient states she was told that they encouraged a f/u referral . Patient has not seen Dr. Lucianne MussKumar in a few years and she does not have a PCP. Is it ok to schedule a new patient appt with him. Please advise.Thank you

## 2017-09-20 NOTE — Telephone Encounter (Signed)
I can only see her for thyroid problems if she is taking thyroid medications.  This will be a new patient as I do not have any records.   Can you find out who is giving her thyroid medications currently?

## 2017-09-20 NOTE — Telephone Encounter (Signed)
Please advise 

## 2017-09-21 NOTE — Telephone Encounter (Signed)
FYI

## 2017-09-21 NOTE — Telephone Encounter (Signed)
Please schedule patient as new patient and have her records faxed to us please. Please see message from Dr. Lucianne MussKumar. Thank you!!

## 2017-09-21 NOTE — Telephone Encounter (Signed)
Called patient she states Dr. Dorothyann Pengobyn Sanders manages her Thyroid levels every 3 mnths and also prescribes her the thyroid medication. I am going to request most recent lab results to be sent to the office. Patient is schedule in Dec for a new patient appt

## 2017-09-26 ENCOUNTER — Encounter: Payer: Self-pay | Admitting: Endocrinology

## 2017-10-09 DIAGNOSIS — N6009 Solitary cyst of unspecified breast: Secondary | ICD-10-CM | POA: Insufficient documentation

## 2017-10-09 DIAGNOSIS — E079 Disorder of thyroid, unspecified: Secondary | ICD-10-CM | POA: Insufficient documentation
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
## 2017-10-20 ENCOUNTER — Ambulatory Visit: Payer: BC Managed Care – PPO | Admitting: Cardiology

## 2017-10-20 ENCOUNTER — Encounter: Payer: Self-pay | Admitting: Cardiology

## 2017-10-20 VITALS — BP 134/69 | HR 80 | Ht 65.0 in | Wt 150.4 lb

## 2017-10-20 DIAGNOSIS — R55 Syncope and collapse: Secondary | ICD-10-CM

## 2017-10-20 DIAGNOSIS — R0602 Shortness of breath: Secondary | ICD-10-CM | POA: Diagnosis not present

## 2017-10-20 DIAGNOSIS — R9431 Abnormal electrocardiogram [ECG] [EKG]: Secondary | ICD-10-CM | POA: Diagnosis not present

## 2017-10-20 NOTE — Patient Instructions (Signed)
Medication Instructions:  Continue current medications  If you need a refill on your cardiac medications before your next appointment, please call your pharmacy.  Labwork: None Ordered  Testing/Procedures: Your physician has requested that you have a lexiscan myoview. For further information please visit [email or web address]. Please follow instruction sheet, as given.  Follow-Up: Your physician wants you to follow-up in: 6 Months. You should receive a reminder letter in the mail two months in advance. If you do not receive a letter, please call our office [phone].    Thank you for choosing [facility]!!      

## 2017-10-26 ENCOUNTER — Encounter: Payer: Self-pay | Admitting: Endocrinology

## 2017-10-26 ENCOUNTER — Ambulatory Visit: Payer: BC Managed Care – PPO | Admitting: Endocrinology

## 2017-10-26 VITALS — BP 126/80 | HR 76 | Ht 65.0 in | Wt 149.2 lb

## 2017-10-26 DIAGNOSIS — E063 Autoimmune thyroiditis: Secondary | ICD-10-CM

## 2017-10-26 DIAGNOSIS — R5383 Other fatigue: Secondary | ICD-10-CM | POA: Diagnosis not present

## 2017-10-26 DIAGNOSIS — R55 Syncope and collapse: Secondary | ICD-10-CM | POA: Diagnosis not present

## 2017-10-26 LAB — TSH: TSH: 0.18 u[IU]/mL — ABNORMAL LOW (ref 0.35–4.50)

## 2017-10-26 LAB — VITAMIN B12: Vitamin B-12: 555 pg/mL (ref 211–911)

## 2017-10-26 NOTE — Patient Instructions (Signed)
Take 6 1/2 pills per week 
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
## 2017-11-17 ENCOUNTER — Inpatient Hospital Stay (HOSPITAL_COMMUNITY)
Admission: RE | Admit: 2017-11-17 | Payer: BC Managed Care – PPO | Source: Ambulatory Visit | Attending: Cardiology | Admitting: Cardiology

## 2017-11-29 ENCOUNTER — Encounter: Payer: Self-pay | Admitting: Neurology

## 2017-11-29 ENCOUNTER — Ambulatory Visit: Payer: BC Managed Care – PPO | Admitting: Neurology

## 2017-11-29 ENCOUNTER — Encounter (INDEPENDENT_AMBULATORY_CARE_PROVIDER_SITE_OTHER): Payer: Self-pay

## 2017-11-29 VITALS — BP 143/79 | HR 68 | Ht 65.0 in | Wt 151.5 lb

## 2017-11-29 DIAGNOSIS — R55 Syncope and collapse: Secondary | ICD-10-CM
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
## 2017-12-08 ENCOUNTER — Ambulatory Visit: Payer: BC Managed Care – PPO | Admitting: Endocrinology

## 2017-12-15 ENCOUNTER — Encounter: Payer: Self-pay | Admitting: Endocrinology

## 2017-12-15 ENCOUNTER — Ambulatory Visit: Payer: BC Managed Care – PPO | Admitting: Endocrinology

## 2017-12-15 VITALS — BP 130/84 | HR 93 | Ht 65.0 in | Wt 151.0 lb

## 2017-12-15 DIAGNOSIS — E063 Autoimmune thyroiditis: Secondary | ICD-10-CM

## 2017-12-15 LAB — T4, FREE: Free T4: 0.69 ng/dL (ref 0.60–1.60)

## 2017-12-15 LAB — T3, FREE: T3, Free: 3.7 pg/mL (ref 2.3–4.2)

## 2017-12-15 LAB — TSH: TSH: 2.74 u[IU]/mL (ref 0.35–4.50)
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
## 2018-02-21 ENCOUNTER — Other Ambulatory Visit: Payer: Self-pay | Admitting: Internal Medicine

## 2018-02-21 DIAGNOSIS — E041 Nontoxic single thyroid nodule: Secondary | ICD-10-CM

## 2018-02-22 ENCOUNTER — Telehealth: Payer: Self-pay | Admitting: Cardiology

## 2018-02-22 NOTE — Telephone Encounter (Signed)
Patient was seen 10/20/2017 and Kathleen Reed was ordered. This test was scheduled and cancelled by patient. She reports she is in much less pain than she was during her last visit - is now taking a joint health supplement, thus she would like an exercise stress test instead of lexiscan. She states the stress test was ordered d/t "blacking out" episodes. She reports she has had a few episodes since her last visit. The episodes happen when she turns her neck at certain angle. She is more aware of when these episodes will occur.  She is now able to stop herself from completely losing consciousness.   Routed to MD

## 2018-02-22 NOTE — Telephone Encounter (Signed)
New Message     Patient would like you to order treadmill test instead of the myocardial perfusion

## 2018-02-23 NOTE — Telephone Encounter (Signed)
Patient called and made aware that MD has recommended lexiscan d/t abnormal EKG and also referral to Dr. Graciela HusbandsKlein. She was explained rationale for the test/referral.   [facility] & Glynda JaegerMelissa Tatum for Dr. Graciela HusbandsKlein referral.

## 2018-02-23 NOTE — Telephone Encounter (Signed)
Kathleen Reed cannot have a POET (Plain Old Exercise Treadmill) as Kathleen Reed has an abnormal EKG.  Needs Lexiscan Myoview.  Also, I wanted to send her to Dr. Graciela HusbandsKlein about her syncope.  Please schedule this.

## 2018-02-27 ENCOUNTER — Ambulatory Visit
Admission: RE | Admit: 2018-02-27 | Discharge: 2018-02-27 | Disposition: A | Discharge status: Home / Self Care | Payer: BC Managed Care – PPO | Source: Ambulatory Visit | Attending: Internal Medicine | Admitting: Internal Medicine

## 2018-02-27 DIAGNOSIS — E041 Nontoxic single thyroid nodule: Secondary | ICD-10-CM

## 2018-03-09 ENCOUNTER — Telehealth (HOSPITAL_COMMUNITY): Payer: Self-pay

## 2018-03-09 NOTE — Telephone Encounter (Signed)
Encounter complete. 

## 2018-03-14 ENCOUNTER — Ambulatory Visit (HOSPITAL_COMMUNITY)
Admission: RE | Admit: 2018-03-14 | Discharge: 2018-03-14 | Disposition: A | Discharge status: Home / Self Care | Payer: BC Managed Care – PPO | Source: Ambulatory Visit | Attending: Internal Medicine | Admitting: Internal Medicine

## 2018-03-14 DIAGNOSIS — R931 Abnormal findings on diagnostic imaging of heart and coronary circulation: Secondary | ICD-10-CM | POA: Diagnosis not present

## 2018-03-14 DIAGNOSIS — R0602 Shortness of breath: Secondary | ICD-10-CM | POA: Diagnosis present

## 2018-03-14 DIAGNOSIS — R9431 Abnormal electrocardiogram [ECG] [EKG]: Secondary | ICD-10-CM | POA: Insufficient documentation

## 2018-03-14 DIAGNOSIS — E782 Mixed hyperlipidemia: Secondary | ICD-10-CM | POA: Insufficient documentation

## 2018-03-14 DIAGNOSIS — I447 Left bundle-branch block, unspecified: Secondary | ICD-10-CM | POA: Diagnosis not present

## 2018-03-14 DIAGNOSIS — I6529 Occlusion and stenosis of unspecified carotid artery: Secondary | ICD-10-CM | POA: Diagnosis not present

## 2018-03-14 LAB — MYOCARDIAL PERFUSION IMAGING
LV dias vol: 105 mL (ref 46–106)
LV sys vol: 50 mL
Peak HR: 112 {beats}/min
Rest HR: 65 {beats}/min
SDS: 4
SRS: 7
SSS: 11
TID: 0.8

## 2018-03-14 MED ORDER — TECHNETIUM TC 99M TETROFOSMIN IV KIT
10.8000 | PACK | Freq: Once | INTRAVENOUS | Status: AC | PRN
  Administered 2018-03-14: 10.8 via INTRAVENOUS
  Filled 2018-03-14: qty 11

## 2018-03-14 MED ORDER — TECHNETIUM TC 99M TETROFOSMIN IV KIT
31.5000 | PACK | Freq: Once | INTRAVENOUS | Status: AC | PRN
  Administered 2018-03-14: 31.5 via INTRAVENOUS
  Filled 2018-03-14: qty 32

## 2018-03-14 MED ORDER — REGADENOSON 0.4 MG/5ML IV SOLN
0.4000 mg | Freq: Once | INTRAVENOUS | Status: AC
  Administered 2018-03-14: 0.4 mg via INTRAVENOUS

## 2018-04-09 ENCOUNTER — Telehealth: Payer: Self-pay | Admitting: Cardiology

## 2018-04-09 NOTE — Telephone Encounter (Signed)
Patient notified that referral to Dr. Graciela Husbands from November 2018 was for syncope. Stress test was ordered for abnormal EKG & SOB. She voiced understanding.

## 2018-04-09 NOTE — Telephone Encounter (Signed)
New Message;   Pt wants to know if she still needs an appointment with Dr Eloy End her Stress Test was good?

## 2018-04-12 ENCOUNTER — Institutional Professional Consult (permissible substitution): Payer: BC Managed Care – PPO | Admitting: Internal Medicine

## 2018-04-13 ENCOUNTER — Ambulatory Visit: Payer: BC Managed Care – PPO | Admitting: Endocrinology

## 2018-09-04 ENCOUNTER — Other Ambulatory Visit: Payer: BC Managed Care – PPO

## 2018-09-04 DIAGNOSIS — E039 Hypothyroidism, unspecified: Secondary | ICD-10-CM

## 2018-09-05 ENCOUNTER — Other Ambulatory Visit: Payer: BC Managed Care – PPO

## 2018-09-05 LAB — TSH: TSH: 0.376 u[IU]/mL — ABNORMAL LOW (ref 0.450–4.500)

## 2018-09-06 NOTE — Progress Notes (Signed)
Here are your lab results:  Your TSH is low. Please confirm how you are taking your Armour. I will then know how to adjust your meds.    Sincerely,    Diego Ulbricht N. Allyne Gee, MD

## 2018-09-13 ENCOUNTER — Other Ambulatory Visit: Payer: Self-pay | Admitting: Internal Medicine

## 2018-09-13 DIAGNOSIS — Z1231 Encounter for screening mammogram for malignant neoplasm of breast: Secondary | ICD-10-CM

## 2018-09-20 ENCOUNTER — Ambulatory Visit: Payer: BC Managed Care – PPO | Admitting: Internal Medicine

## 2018-09-20 ENCOUNTER — Encounter: Payer: Self-pay | Admitting: Internal Medicine

## 2018-09-20 VITALS — BP 116/76 | HR 97 | Temp 98.0°F | Ht 65.0 in | Wt 136.8 lb

## 2018-09-20 DIAGNOSIS — R002 Palpitations: Secondary | ICD-10-CM

## 2018-09-20 DIAGNOSIS — E039 Hypothyroidism, unspecified: Secondary | ICD-10-CM

## 2018-09-20 MED ORDER — THYROID 15 MG PO TABS: ORAL_TABLET | ORAL | 1 refills | Status: DC

## 2018-09-20 MED ORDER — THYROID 60 MG PO TABS: ORAL_TABLET | ORAL | 0 refills | Status: DC

## 2018-09-20 NOTE — Progress Notes (Signed)
Subjective:     Patient ID: Kathleen Reed , female    DOB: 1956-06-25 , 62 y.o.   MRN: [id number]   Chief Complaint  Patient presents with  . Hypothyroidism  . Headache    past 2 weeks     HPI  Having trouble sleeping, palpitations x 4 days. Has lots night sweats and some during the day. Has loose stools, increased hunger. Has been having a lot of HA. Feels tightness on sides of neck and almost trouble swallowing, got choked a couple of times, but the food is not getting stuck. No syncope, or tremors. She failed to tell me she had TSH done 2 weeks ago and her TSH is low and mentioned this when she was sent to the lab. She has been taking her Armour thyroid 90 mg qd.   Past Medical History:  Diagnosis Date  . Breast cyst   . Syncope   . Thyroid disease      Family History  Problem Relation Age of Onset  . Hypertension Mother   . Hyperlipidemia Mother   . Osteoporosis Mother   . Cancer Mother        uterine  . Hyperlipidemia Father   . Hypertension Father   . Cancer Father        colon  . Cancer Sister        breast   . Thyroid disease Sister   . Diabetes Maternal Aunt   . Diabetes Maternal Uncle   . Diabetes Maternal Grandmother      Current Outpatient Medications:  .  Acetylcysteine (NAC) 600 MG CAPS, Take 1 capsule by mouth daily., Disp: , Rfl:  .  ARMOUR THYROID 90 MG tablet, Take 1 tablet by mouth daily., Disp: , Rfl:  .  Cholecalciferol (VITAMIN D3) 1000 UNITS CAPS, Take by mouth., Disp: , Rfl:  .  co-enzyme Q-10 30 MG capsule, Take 100 mg by mouth daily. , Disp: , Rfl:  .  Estradiol-Estriol-Progesterone (BIEST/PROGESTERONE) CREA, Place onto the skin as needed., Disp: , Rfl:  .  KRILL OIL 1000 MG CAPS, Take by mouth daily., Disp: , Rfl:  .  magnesium oxide (MAG-OX) 400 MG tablet, Take 400 mg by mouth daily., Disp: , Rfl:  .  Misc Natural Products (OSTEO BI-FLEX JOINT SHIELD PO), Take 2 tablets by mouth daily. Glucosamine/Chondroitin, Disp: , Rfl:  .   pseudoephedrine (SUDAFED) 30 MG tablet, Take 30 mg by mouth every 4 (four) hours as needed for congestion., Disp: , Rfl:  .  ZYRTEC ALLERGY 10 MG CAPS, Take 1 capsule by mouth daily as needed., Disp: , Rfl:    Allergies  Allergen Reactions  . Demerol [Meperidine]     Caused increased blood pressure, redness in face  . Amoxicillin Hives  . Augmentin [Amoxicillin-Pot Clavulanate]   . Penicillins Rash     Review of Systems  Constitutional: Negative for unexpected weight change.  HENT: Negative for trouble swallowing.   Respiratory: Negative for cough, chest tightness, shortness of breath and wheezing.   Cardiovascular: Positive for palpitations. Negative for chest pain and leg swelling.  Gastrointestinal: Positive for diarrhea. Negative for abdominal pain and constipation.  Endocrine: Positive for polydipsia and polyphagia. Negative for polyuria.  Genitourinary: Positive for frequency.  Skin: Negative for rash.       No skin changes or increased hair falling.   Neurological: Negative for tremors.     Today's Vitals   09/20/18 1125  BP: 116/76  Pulse: 97  Temp: 98  F (36.7 C)  TempSrc: Oral  SpO2: 98%  Weight: 136 lb 12.8 oz (62.1 kg)  Height: 5\' 5"  (1.651 m)   Body mass index is 22.76 kg/m.   Objective:  Physical Exam  Constitutional: She is oriented to person, place, and time. She appears well-developed and well-nourished.  Non-toxic appearance. She does not appear ill. No distress.  HENT:  Head: Normocephalic.  Eyes: EOM are normal. No scleral icterus.  Neck: Neck supple.  Cardiovascular: Normal rate and intact distal pulses. Exam reveals gallop.  No murmur heard. Pulmonary/Chest: Effort normal and breath sounds normal.  Musculoskeletal: Normal range of motion. She exhibits no edema.  Lymphadenopathy:    She has no cervical adenopathy.  Neurological: She is alert and oriented to person, place, and time. She has normal strength.  No tremor noted when I had her  extend her arms forward  Skin: Skin is warm and dry. Capillary refill takes less than 2 seconds.  Psychiatric: She has a normal mood and affect. Her behavior is normal.  Nursing note and vitals reviewed.       Assessment And Plan:     1. Palpitations- declined EKG, will see her cardiologist in 2 weeeks  2. Hypothyroidism, unspecified type- with low TSH, needs to reduce her dose to 75 mg Armour. New rxs sent after consulting with Dr Allyne Gee.  Needs labs in 6-8 weeks.  - TSH; Future - T4, Free; Future - T3, free; Future  Timberlynn Kizziah RODRIGUEZ-SOUTHWORTH, PA-C
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
## 2018-10-04 ENCOUNTER — Encounter: Payer: Self-pay | Admitting: Cardiology

## 2018-10-04 ENCOUNTER — Ambulatory Visit: Payer: BC Managed Care – PPO | Admitting: Cardiology

## 2018-10-04 VITALS — BP 154/88 | HR 72 | Ht 65.0 in | Wt 138.0 lb

## 2018-10-04 DIAGNOSIS — I6523 Occlusion and stenosis of bilateral carotid arteries: Secondary | ICD-10-CM

## 2018-10-04 DIAGNOSIS — R42 Dizziness and giddiness: Secondary | ICD-10-CM

## 2018-10-04 DIAGNOSIS — I447 Left bundle-branch block, unspecified: Secondary | ICD-10-CM | POA: Diagnosis not present

## 2018-10-04 NOTE — Patient Instructions (Signed)
Medication Instructions:  Continue current medications  If you need a refill on your cardiac medications before your next appointment, please call your pharmacy.  Labwork: None Ordered   If you have labs (blood work) drawn today and your tests are completely normal, you will receive your results only by: Marland Kitchen. MyChart Message (if you have MyChart) OR . A paper copy in the mail If you have any lab test that is abnormal or we need to change your treatment, we will call you to review the results.  Testing/Procedures: Your physician has requested that you have a carotid duplex in August 2020. This test is an ultrasound of the carotid arteries in your neck. It looks at blood flow through these arteries that supply the brain with blood. Allow one hour for this exam. There are no restrictions or special instructions.  Follow-Up: You will need a follow up appointment in 18 Months.  Please call our office 2 months in [phone]) to schedule the (18 Months) appointment.  You may see  DR Antoine PocheHochrein, or one of the following Advanced Practice Providers on your designated Care Team:    . Joni ReiningKathryn Lawrence, DNP, ANP . Rhonda Barrett, PA-C  . Corine ShelterLuke Kilroy, [place]PA-C  . Azalee CourseHao Meng, PA-C . Micah FlesherAngela Duke, PA-C  At [facility], you and your health needs are our priority.  As part of our continuing mission to provide you with exceptional heart care, we have created designated Provider Care Teams.  These Care Teams include your primary Cardiologist (physician) and Advanced Practice Providers (APPs -  Physician Assistants and Nurse Practitioners) who all work together to provide you with the care you need, when you need it.    Thank you for choosing [facility] at [facility]!!

## 2018-10-24 ENCOUNTER — Ambulatory Visit
Admission: RE | Admit: 2018-10-24 | Discharge: 2018-10-24 | Disposition: A | Discharge status: Home / Self Care | Payer: BC Managed Care – PPO | Source: Ambulatory Visit | Attending: Internal Medicine | Admitting: Internal Medicine

## 2018-10-24 DIAGNOSIS — Z1231 Encounter for screening mammogram for malignant neoplasm of breast: Secondary | ICD-10-CM

## 2018-11-13 ENCOUNTER — Encounter: Payer: Self-pay | Admitting: Internal Medicine

## 2018-11-13 ENCOUNTER — Ambulatory Visit: Payer: BC Managed Care – PPO | Admitting: Internal Medicine

## 2018-11-13 VITALS — BP 140/78 | HR 62 | Temp 98.2°F | Ht 65.0 in | Wt 139.6 lb

## 2018-11-13 DIAGNOSIS — Z1211 Encounter for screening for malignant neoplasm of colon: Secondary | ICD-10-CM | POA: Diagnosis not present

## 2018-11-13 DIAGNOSIS — E039 Hypothyroidism, unspecified: Secondary | ICD-10-CM

## 2018-11-13 DIAGNOSIS — R030 Elevated blood-pressure reading, without diagnosis of hypertension: Secondary | ICD-10-CM

## 2018-11-13 DIAGNOSIS — N951 Menopausal and female climacteric states: Secondary | ICD-10-CM | POA: Diagnosis not present

## 2018-11-13 NOTE — Patient Instructions (Signed)
Menopause  Menopause is the normal time of life when menstrual periods stop completely. It is usually confirmed by 12 months without a menstrual period. The transition to menopause (perimenopause) most often happens between the ages of 45 and 55. During perimenopause, hormone levels change in your body, which can cause symptoms and affect your health. Menopause may increase your risk for:   Loss of bone (osteoporosis), which causes bone breaks (fractures).   Depression.   Hardening and narrowing of the arteries (atherosclerosis), which can cause heart attacks and strokes.  What are the causes?  This condition is usually caused by a natural change in hormone levels that happens as you get older. The condition may also be caused by surgery to remove both ovaries (bilateral oophorectomy).  What increases the risk?  This condition is more likely to start at an earlier age if you have certain medical conditions or treatments, including:   A tumor of the pituitary gland in the brain.   A disease that affects the ovaries and hormone production.   Radiation treatment for cancer.   Certain cancer treatments, such as chemotherapy or hormone (anti-estrogen) therapy.   Heavy smoking and excessive alcohol use.   Family history of early menopause.  This condition is also more likely to develop earlier in women who are very thin.  What are the signs or symptoms?  Symptoms of this condition include:   Hot flashes.   Irregular menstrual periods.   Night sweats.   Changes in feelings about sex. This could be a decrease in sex drive or an increased comfort around your sexuality.   Vaginal dryness and thinning of the vaginal walls. This may cause painful intercourse.   Dryness of the skin and development of wrinkles.   Headaches.   Problems sleeping (insomnia).   Mood swings or irritability.   Memory problems.   Weight gain.   Hair growth on the face and chest.   Bladder infections or problems with urinating.  How  is this diagnosed?  This condition is diagnosed based on your medical history, a physical exam, your age, your menstrual history, and your symptoms. Hormone tests may also be done.  How is this treated?  In some cases, no treatment is needed. You and your health care provider should make a decision together about whether treatment is necessary. Treatment will be based on your individual condition and preferences. Treatment for this condition focuses on managing symptoms. Treatment may include:   Menopausal hormone therapy (MHT).   Medicines to treat specific symptoms or complications.   Acupuncture.   Vitamin or herbal supplements.  Before starting treatment, make sure to let your health care provider know if you have a personal or family history of:   Heart disease.   Breast cancer.   Blood clots.   Diabetes.   Osteoporosis.  Follow these instructions at home:  Lifestyle   Do not use any products that contain nicotine or tobacco, such as cigarettes and e-cigarettes. If you need help quitting, ask your health care provider.   Get at least 30 minutes of physical activity on 5 or more days each week.   Avoid alcoholic and caffeinated beverages, as well as spicy foods. This may help prevent hot flashes.   Get 7-8 hours of sleep each night.   If you have hot flashes, try:  ? Dressing in layers.  ? Avoiding things that may trigger hot flashes, such as spicy food, warm places, or stress.  ? Taking slow, deep   breaths when a hot flash starts.  ? Keeping a fan in your home and office.   Find ways to manage stress, such as deep breathing, meditation, or journaling.   Consider going to group therapy with other women who are having menopause symptoms. Ask your health care provider about recommended group therapy meetings.  Eating and drinking   Eat a healthy, balanced diet that contains whole grains, lean protein, low-fat dairy, and plenty of fruits and vegetables.   Your health care provider may recommend  adding more soy to your diet. Foods that contain soy include tofu, tempeh, and soy milk.   Eat plenty of foods that contain calcium and vitamin D for bone health. Items that are rich in calcium include low-fat milk, yogurt, beans, almonds, sardines, broccoli, and kale.  Medicines   Take over-the-counter and prescription medicines only as told by your health care provider.   Talk with your health care provider before starting any herbal supplements. If prescribed, take vitamins and supplements as told by your health care provider. These may include:  ? Calcium. Women age 51 and older should get 1,200 mg (milligrams) of calcium every day.  ? Vitamin D. Women need 600-800 International Units of vitamin D each day.  ? Vitamins B12 and B6. Aim for 50 micrograms of B12 and 1.5 mg of B6 each day.  General instructions   Keep track of your menstrual periods, including:  ? When they occur.  ? How heavy they are and how long they last.  ? How much time passes between periods.   Keep track of your symptoms, noting when they start, how often you have them, and how long they last.   Use vaginal lubricants or moisturizers to help with vaginal dryness and improve comfort during sex.   Keep all follow-up visits as told by your health care provider. This is important. This includes any group therapy or counseling.  Contact a health care provider if:   You are still having menstrual periods after age 55.   You have pain during sex.   You have not had a period for 12 months and you develop vaginal bleeding.  Get help right away if:   You have:  ? Severe depression.  ? Excessive vaginal bleeding.  ? Pain when you urinate.  ? A fast or irregular heart beat (palpitations).  ? Severe headaches.  ? Abdomen (abdominal) pain or severe indigestion.   You fell and you think you have a broken bone.   You develop leg or chest pain.   You develop vision problems.   You feel a lump in your breast.  Summary   Menopause is the normal  time of life when menstrual periods stop completely. It is usually confirmed by 12 months without a menstrual period.   The transition to menopause (perimenopause) most often happens between the ages of 45 and 55.   Symptoms can be managed through medicines, lifestyle changes, and complementary therapies such as acupuncture.   Eat a balanced diet that is rich in nutrients to promote bone health and heart health and to manage symptoms during menopause.  This information is not intended to replace advice given to you by your health care provider. Make sure you discuss any questions you have with your health care provider.  Document Released: 01/28/2004 Document Revised: 12/10/2016 Document Reviewed: 12/10/2016  Elsevier Interactive Patient Education  2019 [employer] Inc[employer]

## 2018-11-14 LAB — HEPATITIS C ANTIBODY: Hep C Virus Ab: <0.1 {s_co_ratio} (ref 0.0–0.9)

## 2018-11-14 LAB — T4, FREE: Free T4: 1.01 ng/dL (ref 0.82–1.77)

## 2018-11-14 LAB — TSH: TSH: 3.72 u[IU]/mL (ref 0.450–4.500)

## 2018-11-14 LAB — T3, FREE: T3, Free: 4.3 pg/mL (ref 2.0–4.4)

## 2018-11-18 ENCOUNTER — Encounter: Payer: Self-pay | Admitting: Internal Medicine

## 2018-11-18 DIAGNOSIS — R030 Elevated blood-pressure reading, without diagnosis of hypertension: Secondary | ICD-10-CM | POA: Insufficient documentation

## 2018-11-18 DIAGNOSIS — N951 Menopausal and female climacteric states: Secondary | ICD-10-CM | POA: Insufficient documentation

## 2018-11-18 NOTE — Progress Notes (Signed)
Subjective:     Patient ID: Kathleen Reed , female    DOB: [birth year] , 62 y.o.   MRN: [id number]   Chief Complaint  Patient presents with  . hormones f/u  . Hypothyroidism    HPI  She is here today for f/u BHRT.  She feels well on her current regimen.  She denies having hot flashes and night sweats.   Thyroid Problem  Presents for follow-up visit. Patient reports no anxiety, cold intolerance, constipation, hoarse voice or visual change. The symptoms have been stable.  She reports she saw the PA last time and her regimen was changed. She feels that her thyroid is likely "off".    Past Medical History:  Diagnosis Date  . Breast cyst   . Syncope   . Thyroid disease      Family History  Problem Relation Age of Onset  . Hypertension Mother   . Hyperlipidemia Mother   . Osteoporosis Mother   . Cancer Mother        uterine  . Hyperlipidemia Father   . Hypertension Father   . Cancer Father        colon  . Cancer Sister        breast   . Thyroid disease Sister   . Breast cancer Sister   . Diabetes Maternal Aunt   . Diabetes Maternal Uncle   . Diabetes Maternal Grandmother      Current Outpatient Medications:  .  COD LIVER OIL PO, Take by mouth., Disp: , Rfl:  .  Estradiol-Estriol-Progesterone (BIEST/PROGESTERONE) CREA, Place onto the skin as needed., Disp: , Rfl:  .  thyroid (ARMOUR THYROID) 15 MG tablet, Take one with a 60 mg Armour, Disp: 30 tablet, Rfl: 1 .  thyroid (ARMOUR THYROID) 60 MG tablet, Take one with a 15 mg Armour thyroid, Disp: 30 tablet, Rfl: 0 .  ZYRTEC ALLERGY 10 MG CAPS, Take 1 capsule by mouth daily as needed., Disp: , Rfl:  .  Acetylcysteine (NAC) 600 MG CAPS, Take 1 capsule by mouth daily., Disp: , Rfl:  .  Cholecalciferol (VITAMIN D3) 1000 UNITS CAPS, Take by mouth., Disp: , Rfl:  .  co-enzyme Q-10 30 MG capsule, Take 100 mg by mouth daily. , Disp: , Rfl:  .  magnesium oxide (MAG-OX) 400 MG tablet, Take 400 mg by mouth daily., Disp: , Rfl:   .  Misc Natural Products (OSTEO BI-FLEX JOINT SHIELD PO), Take 2 tablets by mouth daily. Glucosamine/Chondroitin, Disp: , Rfl:    Allergies  Allergen Reactions  . Demerol [Meperidine]     Caused increased blood pressure, redness in face  . Amoxicillin Hives  . Augmentin [Amoxicillin-Pot Clavulanate]   . Penicillins Rash     Review of Systems  Constitutional: Negative.   HENT: Negative for hoarse voice.   Respiratory: Negative.   Cardiovascular: Negative.   Gastrointestinal: Negative.  Negative for constipation.  Endocrine: Negative for cold intolerance.  Neurological: Negative.   Psychiatric/Behavioral: Negative.  The patient is not nervous/anxious.      Today's Vitals   11/13/18 1005  BP: 140/78  Pulse: 62  Temp: 98.2 F (36.8 C)  TempSrc: Oral  Weight: 139 lb 9.6 oz (63.3 kg)  Height: 5\' 5"  (1.651 m)   Body mass index is 23.23 kg/m.   Objective:  Physical Exam Vitals signs and nursing note reviewed.  Constitutional:      Appearance: Normal appearance.  HENT:     Head: Normocephalic and atraumatic.  Cardiovascular:  Rate and Rhythm: Normal rate and regular rhythm.     Heart sounds: Normal heart sounds.  Pulmonary:     Effort: Pulmonary effort is normal.     Breath sounds: Normal breath sounds.  Skin:    General: Skin is warm.  Neurological:     General: No focal deficit present.     Mental Status: She is alert.  Psychiatric:        Mood and Affect: Mood normal.         Assessment And Plan:     1. Female climacteric state  Her symptoms have improved while on BHRT therapy. She will continue with her current regimen. She will rto in four months for re-evaluation.   - Hepatitis C antibody  2. Elevated blood pressure reading  She admits to being under a great deal of stress. I will re-evaluate this at her next visit. She is encouraged to resume her regular exercise regimen and to avoid adding salt to her foods.   3. Hypothyroidism, unspecified  type  I will check a thyroid panel and adjust meds as needed.  - TSH - T4, Free - T3, free  4. Screen for colon cancer  She does not wish to have colonoscopy; however, she does agree to proceed with Cologuard testing.   - Cologuard        Gwynneth Alimentobyn N Abiageal Blowe, MD

## 2018-11-18 NOTE — Progress Notes (Signed)
Here are your lab results:  You are negative for hepatitis c virus.  Happy new year!  Sincerely,    Mesha Schamberger N. Allyne GeeSanders, MD

## 2018-11-20 ENCOUNTER — Other Ambulatory Visit: Payer: Self-pay | Admitting: Internal Medicine

## 2018-12-04 ENCOUNTER — Encounter: Payer: Self-pay | Admitting: Internal Medicine

## 2018-12-06 ENCOUNTER — Other Ambulatory Visit: Payer: Self-pay | Admitting: Internal Medicine

## 2018-12-06 DIAGNOSIS — R195 Other fecal abnormalities: Secondary | ICD-10-CM

## 2019-01-03 ENCOUNTER — Other Ambulatory Visit: Payer: Self-pay | Admitting: Internal Medicine

## 2019-01-24 ENCOUNTER — Encounter: Payer: Self-pay | Admitting: Internal Medicine

## 2019-02-05 ENCOUNTER — Other Ambulatory Visit: Payer: Self-pay | Admitting: Internal Medicine

## 2019-02-07 ENCOUNTER — Other Ambulatory Visit: Payer: Self-pay | Admitting: Internal Medicine

## 2019-02-26 ENCOUNTER — Ambulatory Visit: Payer: BC Managed Care – PPO | Admitting: Internal Medicine

## 2019-02-26 ENCOUNTER — Other Ambulatory Visit (HOSPITAL_COMMUNITY)
Admission: RE | Admit: 2019-02-26 | Discharge: 2019-02-26 | Disposition: A | Discharge status: Home / Self Care | Payer: BC Managed Care – PPO | Source: Ambulatory Visit | Attending: Internal Medicine | Admitting: Internal Medicine

## 2019-02-26 ENCOUNTER — Other Ambulatory Visit: Payer: Self-pay

## 2019-02-26 ENCOUNTER — Encounter: Payer: Self-pay | Admitting: Internal Medicine

## 2019-02-26 VITALS — BP 136/86 | HR 75 | Temp 98.0°F | Ht 65.0 in | Wt 138.6 lb

## 2019-02-26 DIAGNOSIS — Z0000 Encounter for general adult medical examination without abnormal findings: Secondary | ICD-10-CM | POA: Diagnosis not present

## 2019-02-26 DIAGNOSIS — Z1211 Encounter for screening for malignant neoplasm of colon: Secondary | ICD-10-CM | POA: Diagnosis not present

## 2019-02-26 DIAGNOSIS — Z90710 Acquired absence of both cervix and uterus: Secondary | ICD-10-CM

## 2019-02-26 DIAGNOSIS — Z01419 Encounter for gynecological examination (general) (routine) without abnormal findings: Secondary | ICD-10-CM | POA: Diagnosis not present

## 2019-02-26 DIAGNOSIS — R195 Other fecal abnormalities: Secondary | ICD-10-CM

## 2019-02-26 DIAGNOSIS — F4321 Adjustment disorder with depressed mood: Secondary | ICD-10-CM | POA: Diagnosis not present

## 2019-02-26 DIAGNOSIS — Z23 Encounter for immunization: Secondary | ICD-10-CM

## 2019-02-26 DIAGNOSIS — R030 Elevated blood-pressure reading, without diagnosis of hypertension: Secondary | ICD-10-CM

## 2019-02-26 DIAGNOSIS — E039 Hypothyroidism, unspecified: Secondary | ICD-10-CM | POA: Diagnosis not present

## 2019-02-26 DIAGNOSIS — Z634 Disappearance and death of family member: Secondary | ICD-10-CM

## 2019-02-26 LAB — POCT URINALYSIS DIPSTICK
Bilirubin, UA: NEGATIVE
Glucose, UA: NEGATIVE
Ketones, UA: NEGATIVE
Leukocytes, UA: NEGATIVE
Nitrite, UA: NEGATIVE
Protein, UA: NEGATIVE
Spec Grav, UA: 1.01 (ref 1.010–1.025)
Urobilinogen, UA: 0.2 E.U./dL
pH, UA: 7 (ref 5.0–8.0)

## 2019-02-26 LAB — POC HEMOCCULT BLD/STL (OFFICE/1-CARD/DIAGNOSTIC): Fecal Occult Blood, POC: NEGATIVE

## 2019-02-26 MED ORDER — TETANUS-DIPHTH-ACELL PERTUSSIS 5-2.5-18.5 LF-MCG/0.5 IM SUSP
0.5000 mL | Freq: Once | INTRAMUSCULAR | Status: AC
  Administered 2019-02-26: 0.5 mL via INTRAMUSCULAR

## 2019-02-26 NOTE — Patient Instructions (Signed)

## 2019-02-26 NOTE — Progress Notes (Signed)
Subjective:     Patient ID: Kathleen Reed , female    DOB: [birth year] , 63 y.o.   MRN: [id number]   Chief Complaint  Patient presents with  . Annual Exam    HPI  She is here today for a full physical examination. She would like to have a pap smear. She has had a hysterectomy due to endometrial thickening and family history of endometrial cancer.     Past Medical History:  Diagnosis Date  . Breast cyst   . Syncope   . Thyroid disease      Family History  Problem Relation Age of Onset  . Hypertension Mother   . Hyperlipidemia Mother   . Osteoporosis Mother   . Cancer Mother        uterine  . Hyperlipidemia Father   . Hypertension Father   . Cancer Father        colon  . Cancer Sister        breast   . Thyroid disease Sister   . Breast cancer Sister   . Diabetes Maternal Aunt   . Diabetes Maternal Uncle   . Diabetes Maternal Grandmother      Current Outpatient Medications:  .  Cholecalciferol (VITAMIN D3) 1000 UNITS CAPS, Take by mouth., Disp: , Rfl:  .  co-enzyme Q-10 30 MG capsule, Take 100 mg by mouth daily. , Disp: , Rfl:  .  COD LIVER OIL PO, Take by mouth., Disp: , Rfl:  .  Estradiol-Estriol-Progesterone (BIEST/PROGESTERONE) CREA, Place onto the skin as needed., Disp: , Rfl:  .  magnesium oxide (MAG-OX) 400 MG tablet, Take 400 mg by mouth daily., Disp: , Rfl:  .  Misc Natural Products (OSTEO BI-FLEX JOINT SHIELD PO), Take 2 tablets by mouth daily. Glucosamine/Chondroitin, Disp: , Rfl:  .  Pseudoephedrine HCl (SUDAFED PO), Take by mouth., Disp: , Rfl:  .  thyroid (ARMOUR THYROID) 15 MG tablet, TAKE 1 TABLET BY MOUTH DAILY TAKE WITH 60MG TABLETS., Disp: 30 tablet, Rfl: 0 .  thyroid (ARMOUR THYROID) 60 MG tablet, TAKE 1 TABLET BY MOUTH ONCE DAILY WITH 15MG TABLETS., Disp: 30 tablet, Rfl: 0 .  ZYRTEC ALLERGY 10 MG CAPS, Take 1 capsule by mouth daily as needed., Disp: , Rfl:  .  Acetylcysteine (NAC) 600 MG CAPS, Take 1 capsule by mouth daily., Disp: , Rfl:    Current Facility-Administered Medications:  .  Tdap (BOOSTRIX) injection 0.5 mL, 0.5 mL, Intramuscular, Once, [name], MD   Allergies  Allergen Reactions  . Demerol [Meperidine]     Caused increased blood pressure, redness in face  . Amoxicillin Hives  . Augmentin [Amoxicillin-Pot Clavulanate]   . Penicillins Rash       No LMP recorded. Patient is postmenopausal..  Negative for: breast discharge, breast lump(s), breast pain and breast self exam. Associated symptoms include abnormal vaginal bleeding. Pertinent negatives include abnormal bleeding (hematology), anxiety, decreased libido, depression, difficulty falling sleep, dyspareunia, history of infertility, nocturia, sexual dysfunction, sleep disturbances, urinary incontinence, urinary urgency, vaginal discharge and vaginal itching. Diet regular.The patient states her exercise level is  intermittent.  . The patient's tobacco use is:  Social History   Tobacco Use  Smoking Status Former Smoker  . Types: Cigarettes  . Last attempt to quit: 11/21/1981  . Years since quitting: 37.2  Smokeless Tobacco Never Used  . She has been exposed to passive smoke. The patient's alcohol use is:  Social History   Substance and Sexual Activity  Alcohol Use No  Review of Systems  Constitutional: Negative.   HENT: Negative.   Eyes: Negative.   Respiratory: Negative.   Cardiovascular: Negative.   Gastrointestinal: Negative.   Endocrine: Negative.   Genitourinary: Negative.   Musculoskeletal: Negative.   Skin: Negative.   Allergic/Immunologic: Negative.   Neurological: Negative.   Hematological: Negative.   Psychiatric/Behavioral: Dysphoric mood: Unfortunately, her daughter passed a month ago today.  She was 87 and had a 18 yr old daughter .She admits that she is having issues sleeping.      Today's Vitals   02/26/19 1025  BP: 136/86  Pulse: 75  Temp: 98 F (36.7 C)  TempSrc: Oral  Weight: 138 lb 9.6 oz (62.9 kg)  Height: 5'  5" (1.651 m)   Body mass index is 23.06 kg/m.   Objective:  Physical Exam Vitals signs and nursing note reviewed.  Constitutional:      Appearance: Normal appearance.  HENT:     Head: Normocephalic and atraumatic.     Right Ear: Tympanic membrane, ear canal and external ear normal.     Left Ear: Tympanic membrane, ear canal and external ear normal.     Nose: Nose normal.     Mouth/Throat:     Mouth: Mucous membranes are moist.     Pharynx: Oropharynx is clear.  Eyes:     Extraocular Movements: Extraocular movements intact.     Conjunctiva/sclera: Conjunctivae normal.     Pupils: Pupils are equal, round, and reactive to light.  Neck:     Musculoskeletal: Normal range of motion and neck supple.  Cardiovascular:     Rate and Rhythm: Normal rate and regular rhythm.     Pulses: Normal pulses.     Heart sounds: Normal heart sounds.  Pulmonary:     Effort: Pulmonary effort is normal.     Breath sounds: Normal breath sounds.  Chest:     Breasts:        Right: Normal. No swelling, bleeding, inverted nipple, mass or nipple discharge.        Left: Normal. No swelling, bleeding, inverted nipple, mass or nipple discharge.  Abdominal:     General: Abdomen is flat. Bowel sounds are normal.     Palpations: Abdomen is soft.     Hernia: There is no hernia in the right inguinal area or left inguinal area.  Genitourinary:    General: Normal vulva.     Exam position: Lithotomy position.     Vagina: Normal. No vaginal discharge.     Uterus: Absent.      Rectum: Normal. Guaiac result negative.     Comments: Absent cervix Musculoskeletal: Normal range of motion.  Lymphadenopathy:     Lower Body: No right inguinal adenopathy. No left inguinal adenopathy.  Skin:    General: Skin is warm and dry.  Neurological:     General: No focal deficit present.     Mental Status: She is alert and oriented to person, place, and time.  Psychiatric:        Mood and Affect: Mood normal.        Behavior:  Behavior normal.         Assessment And Plan:     1. Routine general medical examination at health care facility  A full exam was performed.  Importance of monthly self breast exams was discussed with the patient. PATIENT HAS BEEN ADVISED TO GET 30-45 MINUTES REGULAR EXERCISE NO LESS THAN FOUR TO FIVE DAYS PER WEEK - BOTH WEIGHTBEARING EXERCISES AND  AEROBIC ARE RECOMMENDED.  SHE IS ADVISED TO FOLLOW A HEALTHY DIET WITH AT LEAST SIX FRUITS/VEGGIES PER DAY, DECREASE INTAKE OF RED MEAT, AND TO INCREASE FISH INTAKE TO TWO DAYS PER WEEK.  MEATS/FISH SHOULD NOT BE FRIED, BAKED OR BROILED IS PREFERABLE.  I SUGGEST WEARING SPF 50 SUNSCREEN ON EXPOSED PARTS AND ESPECIALLY WHEN IN THE DIRECT SUNLIGHT FOR AN EXTENDED PERIOD OF TIME.  PLEASE AVOID FAST FOOD RESTAURANTS AND INCREASE YOUR WATER INTAKE.  - CMP14+EGFR - CBC - Lipid panel - Hemoglobin A1c - TSH - T4, Free  2. Cervical smear, as part of routine gynecological examination  Pap smear performed. Hemoccult is negative.   - Cytology -Pap Smear  3. Elevated blood pressure reading  Last three readings were reviewed with the patient. I advised patient that it is time to treat her for HTN. She declines therapy.   4. Hypothyroidism, unspecified type  I will check thyroid panel and adjust meds as needed.   5. Grief at loss of child  She does not wish to go to grief counseling at this time. She will consider this in the future.   6. Need for vaccination  - Tdap (BOOSTRIX) injection 0.5 mL  7. Positive colorectal cancer screening using Cologuard test  She is planning to have colonoscopy performed in June. She wants to wait until she has Medicare so it can save her some money.   Maximino Greenland, MD    THE PATIENT IS ENCOURAGED TO PRACTICE SOCIAL DISTANCING DUE TO THE COVID-19 PANDEMIC.

## 2019-02-27 LAB — CMP14+EGFR
ALT: 19 IU/L (ref 0–32)
AST: 26 IU/L (ref 0–40)
Albumin/Globulin Ratio: 2.3 — ABNORMAL HIGH (ref 1.2–2.2)
Albumin: 4.6 g/dL (ref 3.8–4.8)
Alkaline Phosphatase: 72 IU/L (ref 39–117)
BUN/Creatinine Ratio: 10 — ABNORMAL LOW (ref 12–28)
BUN: 9 mg/dL (ref 8–27)
Bilirubin Total: 0.3 mg/dL (ref 0.0–1.2)
CO2: 23 mmol/L (ref 20–29)
Calcium: 9.6 mg/dL (ref 8.7–10.3)
Chloride: 104 mmol/L (ref 96–106)
Creatinine, Ser: 0.87 mg/dL (ref 0.57–1.00)
GFR calc Af Amer: 81 mL/min/{1.73_m2} (ref >59)
GFR calc non Af Amer: 71 mL/min/{1.73_m2} (ref >59)
Globulin, Total: 2 g/dL (ref 1.5–4.5)
Glucose: 88 mg/dL (ref 65–99)
Potassium: 4.1 mmol/L (ref 3.5–5.2)
Sodium: 145 mmol/L — ABNORMAL HIGH (ref 134–144)
Total Protein: 6.6 g/dL (ref 6.0–8.5)

## 2019-02-27 LAB — LIPID PANEL
Chol/HDL Ratio: 3.1 ratio (ref 0.0–4.4)
Cholesterol, Total: 220 mg/dL — ABNORMAL HIGH (ref 100–199)
HDL: 71 mg/dL (ref >39)
LDL Calculated: 139 mg/dL — ABNORMAL HIGH (ref 0–99)
Triglycerides: 50 mg/dL (ref 0–149)
VLDL Cholesterol Cal: 10 mg/dL (ref 5–40)

## 2019-02-27 LAB — CBC
Hematocrit: 39.3 % (ref 34.0–46.6)
Hemoglobin: 13.5 g/dL (ref 11.1–15.9)
MCH: 31.3 pg (ref 26.6–33.0)
MCHC: 34.4 g/dL (ref 31.5–35.7)
MCV: 91 fL (ref 79–97)
Platelets: 278 10*3/uL (ref 150–450)
RBC: 4.32 x10E6/uL (ref 3.77–5.28)
RDW: 12.1 % (ref 11.7–15.4)
WBC: 7.1 10*3/uL (ref 3.4–10.8)

## 2019-02-27 LAB — TSH: TSH: 4.84 u[IU]/mL — ABNORMAL HIGH (ref 0.450–4.500)

## 2019-02-27 LAB — HEMOGLOBIN A1C
Est. average glucose Bld gHb Est-mCnc: 103 mg/dL
Hgb A1c MFr Bld: 5.2 % (ref 4.8–5.6)

## 2019-02-27 LAB — T4, FREE: Free T4: 1.04 ng/dL (ref 0.82–1.77)

## 2019-03-05 LAB — CYTOLOGY - PAP
Diagnosis: NEGATIVE
HPV: NOT DETECTED

## 2019-03-14 ENCOUNTER — Other Ambulatory Visit: Payer: Self-pay | Admitting: Internal Medicine

## 2019-04-08 ENCOUNTER — Telehealth: Payer: Self-pay | Admitting: Internal Medicine

## 2019-04-08 NOTE — Telephone Encounter (Signed)
PT LVM REQ APPT ATT TO CONTACT PT NO ANS LVM TO CALL OFC °

## 2019-04-09 ENCOUNTER — Encounter: Payer: Self-pay | Admitting: Internal Medicine

## 2019-04-09 ENCOUNTER — Other Ambulatory Visit: Payer: Self-pay

## 2019-04-09 ENCOUNTER — Ambulatory Visit: Payer: BC Managed Care – PPO | Admitting: Internal Medicine

## 2019-04-09 VITALS — BP 116/82 | HR 69 | Temp 98.5°F | Ht 65.0 in | Wt 133.8 lb

## 2019-04-09 DIAGNOSIS — R030 Elevated blood-pressure reading, without diagnosis of hypertension: Secondary | ICD-10-CM | POA: Diagnosis not present

## 2019-04-09 DIAGNOSIS — E039 Hypothyroidism, unspecified: Secondary | ICD-10-CM

## 2019-04-09 DIAGNOSIS — F5101 Primary insomnia: Secondary | ICD-10-CM

## 2019-04-09 DIAGNOSIS — Z634 Disappearance and death of family member: Secondary | ICD-10-CM

## 2019-04-09 DIAGNOSIS — K219 Gastro-esophageal reflux disease without esophagitis: Secondary | ICD-10-CM | POA: Diagnosis not present

## 2019-04-09 DIAGNOSIS — F4321 Adjustment disorder with depressed mood: Secondary | ICD-10-CM

## 2019-04-09 MED ORDER — TEMAZEPAM 15 MG PO CAPS: 15.0000 mg | ORAL_CAPSULE | Freq: Every evening | ORAL | 0 refills | Status: DC | PRN

## 2019-04-09 NOTE — Patient Instructions (Signed)
Potassium Content of Foods    Potassium is a mineral found in many foods and drinks. It affects how the heart works, and helps keep fluids and minerals balanced in the body.  The amount of potassium you need each day depends on your age and any medical conditions you may have. Talk to your health care provider or dietitian about how much potassium you need.  The following lists of foods provide the general serving size for foods and the approximate amount of potassium in each serving, listed in milligrams (mg). Actual values may vary depending on the product and how it is processed.  High in potassium  The following foods and beverages have 200 mg or more of potassium per serving:  · Apricots (raw) - 2 have 200 mg of potassium.  · Apricots (dry) - 5 have 200 mg of potassium.  · Artichoke - 1 medium has 345 mg of potassium.  · Avocado - ¼ fruit has 245 mg of potassium.  · Banana - 1 medium fruit has 425 mg of potassium.  · Lima or baked beans (canned) - ½ cup has 280 mg of potassium.  · White beans (canned) - ½ cup has 595 mg potassium.  · Beef roast - 3 oz has 320 mg of potassium.  · Ground beef - 3 oz has 270 mg of potassium.  · Beets (raw or cooked) - ½ cup has 260 mg of potassium.  · Bran muffin - 2 oz has 300 mg of potassium.  · Broccoli (cooked) - ½ cup has 230 mg of potassium.  · Brussels sprouts - ½ cup has 250 mg of potassium.  · Cantaloupe - ½ cup has 215 mg of potassium.  · Cereal, 100% bran - ½ cup has 200-400 mg of potassium.  · Cheeseburger -1 single fast food burger has 225-400 mg of potassium.  · Chicken - 3 oz has 220 mg of potassium.  · Clams (canned) - 3 oz has 535 mg of potassium.  · Crab - 3 oz has 225 mg of potassium.  · Dates - 5 have 270 mg of potassium.  · Dried beans and peas - ½ cup has 300-475 mg of potassium.  · Figs (dried) - 2 have 260 mg of potassium.  · Fish (halibut, tuna, cod, snapper) - 3 oz has 480 mg of potassium.  · Fish (salmon, haddock, swordfish, perch) - 3 oz has 300 mg of  potassium.  · Fish (tuna, canned) - 3 oz has 200 mg of potassium.  · French fries (fast food) - 3 oz has 470 mg of potassium.  · Granola with fruit and nuts - ½ cup has 200 mg of potassium.  · Grapefruit juice - ½ cup has 200 mg of potassium.  · Honeydew melon - ½ cup has 200 mg of potassium.  · Kale (raw) - 1 cup has 300 mg of potassium.  · Kiwi - 1 medium fruit has 240 mg of potassium.  · Kohlrabi, rutabaga, parsnips - ½ cup has 280 mg of potassium.  · Lentils - ½ cup has 365 mg of potassium.  · Mango - 1 each has 325 mg of potassium.  · Milk (nonfat, low-fat, whole, buttermilk) - 1 cup has 350-380 mg of potassium.  · Milk (chocolate) - 1 cup has 420 mg of potassium  · Molasses - 1 Tbsp has 295 mg of potassium.  · Mushrooms - ½ cup has 280 mg of potassium.  · Nectarine - 1 each has   275 mg of potassium.  · Nuts (almonds, peanuts, hazelnuts, [place], cashew, mixed) - 1 oz has 200 mg of potassium.  · Nuts (pistachios) - 1 oz has 295 mg of potassium.  · Orange - 1 fruit has 240 mg of potassium.  · Orange juice - ½ cup has 235 mg of potassium.  · Papaya - ½ medium fruit has 390 mg of potassium.  · Peanut butter (chunky) - 2 Tbsp has 240 mg of potassium.  · Peanut butter (smooth) - 2 Tbsp has 210 mg of potassium.  · Pear - 1 medium (200 mg) of potassium.  · Pomegranate - 1 whole fruit has 400 mg of potassium.  · Pomegranate juice - ½ cup has 215 mg of potassium.  · Pork - 3 oz has 350 mg of potassium.  · Potato chips (salted) - 1 oz has 465 mg of potassium.  · Potato (baked with skin) - 1 medium has 925 mg of potassium.  · Potato (boiled) - ½ cup has 255 mg of potassium.  · Potato (Mashed) - ½ cup has 330 mg of potassium.  · Prune juice - ½ cup has 370 mg of potassium.  · Prunes - 5 have 305 mg of potassium.  · Pudding (chocolate) - ½ cup has 230 mg of potassium.  · Pumpkin (canned) - ½ cup has 250 mg of potassium.  · Raisins (seedless) - ¼ cup has 270 mg of potassium.  · Seeds (sunflower or pumpkin) - 1 oz has 240 mg of  potassium.  · Soy milk - 1 cup has 300 mg of potassium.  · Spinach (cooked) - 1/2 cup has 420 mg of potassium.  · Spinach (canned) - ½ cup has 370 mg of potassium.  · Sweet potato (baked with skin) - 1 medium has 450 mg of potassium.  · Swiss chard - ½ cup has 480 mg of potassium.  · Tomato or vegetable juice - ½ cup has 275 mg of potassium.  · Tomato (sauce or puree) - ½ cup has 400-550 mg of potassium.  · Tomato (raw) - 1 medium has 290 mg of potassium.  · Tomato (canned) - ½ cup has 200-300 mg of potassium.  · Turkey - 3 oz has 250 mg of potassium.  · Wheat germ - 1 oz has 250 mg of potassium.  · Winter squash - ½ cup has 250 mg of potassium.  · Yogurt (plain or fruited) - 6 oz has 260-435 mg of potassium.  · Zucchini - ½ cup has 220 mg of potassium.  Moderate in potassium  The following foods and beverages have 50-200 mg of potassium per serving:  · Apple - 1 fruit has 150 mg of potassium  · Apple juice - ½ cup has 150 mg of potassium  · Applesauce - ½ cup has 90 mg of potassium  · Apricot nectar - ½ cup has 140 mg of potassium  · Asparagus (small spears) - ½ cup has 155 mg of potassium  · Asparagus (large spears) - 6 have 155 mg of potassium  · Bagel (cinnamon raisin) - 1 four-inch bagel has 130 mg of potassium  · Bagel (egg or plain) - 1 four- inch bagel has 70 mg of potassium  · Beans (green) - ½ cup has 90 mg of potassium  · Beans (yellow) - ½ cup has 190 mg of potassium  · Beer, regular - 12 oz has 100 mg of potassium  · Beets (canned) - ½ cup has   125 mg of potassium  · Blackberries - ½ cup has 115 mg of potassium  · Blueberries - ½ cup has 60 mg of potassium  · Bread (whole wheat) - 1 slice has 70 mg of potassium  · Broccoli (raw) - ½ cup has 145 mg of potassium  · Cabbage - ½ cup has 150 mg of potassium  · Carrots (cooked or raw) - ½ cup has 180 mg of potassium  · Cauliflower (raw) - ½ cup has 150 mg of potassium  · Celery (raw) - ½ cup has 155 mg of potassium  · Cereal, bran flakes - ½ cup has 120-150 mg  of potassium  · Cheese (cottage) - ½ cup has 110 mg of potassium  · Cherries - 10 have 150 mg of potassium  · Chocolate - 1½ oz bar has 165 mg of potassium  · Coffee (brewed) - 6 oz has 90 mg of potassium  · Corn - ½ cup or 1 ear has 195 mg of potassium  · Cucumbers - ½ cup has 80 mg of potassium  · Egg - 1 large egg has 60 mg of potassium  · Eggplant - ½ cup has 60 mg of potassium  · Endive (raw) - ½ cup has 80 mg of potassium  · English muffin - 1 has 65 mg of potassium  · Fish (ocean perch) - 3 oz has 192 mg of potassium  · Frankfurter, beef or pork - 1 has 75 mg of potassium  · Fruit cocktail - ½ cup has 115 mg of potassium  · Grape juice - ½ cup has 170 mg of potassium  · Grapefruit - ½ fruit has 175 mg of potassium  · Grapes - ½ cup has 155 mg of potassium  · Greens: kale, turnip, collard - ½ cup has 110-150 mg of potassium  · Ice cream or frozen yogurt (chocolate) - ½ cup has 175 mg of potassium  · Ice cream or frozen yogurt (vanilla) - ½ cup has 120-150 mg of potassium  · Lemons, limes - 1 each has 80 mg of potassium  · Lettuce - 1 cup has 100 mg of potassium  · Mixed vegetables - ½ cup has 150 mg of potassium  · Mushrooms, raw - ½ cup has 110 mg of potassium  · Nuts (walnuts, pecans, or macadamia) - 1 oz has 125 mg of potassium  · Oatmeal - ½ cup has 80 mg of potassium  · Okra - ½ cup has 110 mg of potassium  · Onions - ½ cup has 120 mg of potassium  · Peach - 1 has 185 mg of potassium  · Peaches (canned) - ½ cup has 120 mg of potassium  · Pears (canned) - ½ cup has 120 mg of potassium  · Peas, green (frozen) - ½ cup has 90 mg of potassium  · Peppers (Green) - ½ cup has 130 mg of potassium  · Peppers (Red) - ½ cup has 160 mg of potassium  · Pineapple juice - ½ cup has 165 mg of potassium  · Pineapple (fresh or canned) - ½ cup has 100 mg of potassium  · Plums - 1 has 105 mg of potassium  · Pudding, vanilla - ½ cup has 150 mg of potassium  · Raspberries - ½ cup has 90 mg of potassium  · Rhubarb - ½ cup has  115 mg of potassium  · Rice, wild - ½ cup has 80 mg of potassium  ·   Shrimp - 3 oz has 155 mg of potassium  · Spinach (raw) - 1 cup has 170 mg of potassium  · Strawberries - ½ cup has 125 mg of potassium  · Summer squash - ½ cup has 175-200 mg of potassium  · Swiss chard (raw) - 1 cup has 135 mg of potassium  · Tangerines - 1 fruit has 140 mg of potassium  · Tea, brewed - 6 oz has 65 mg of potassium  · Turnips - ½ cup has 140 mg of potassium  · Watermelon - ½ cup has 85 mg of potassium  · Wine (Red, table) - 5 oz has 180 mg of potassium  · Wine (White, table) - 5 oz 100 mg of potassium  Low in potassium  The following foods and beverages have less than 50 mg of potassium per serving.  · Bread (white) - 1 slice has 30 mg of potassium  · Carbonated beverages - 12 oz has less than 5 mg of potassium  · Cheese - 1 oz has 20-30 mg of potassium  · Cranberries - ½ cup has 45 mg of potassium  · Cranberry juice cocktail - ½ cup has 20 mg of potassium  · Fats and oils - 1 Tbsp has less than 5 mg of potassium  · Hummus - 1 Tbsp has 32 mg of potassium  · Nectar (papaya, mango, or pear) - ½ cup has 35 mg of potassium  · Rice (white or brown) - ½ cup has 50 mg of potassium  · Spaghetti or macaroni (cooked) - ½ cup has 30 mg of potassium  · Tortilla, flour or corn - 1 has 50 mg of potassium  · Waffle - 1 four-inch waffle has 50 mg of potassium  · Water chestnuts - ½ cup has 40 mg of potassium  Summary  · Potassium is a mineral found in many foods and drinks. It affects how the heart works, and helps keep fluids and minerals balanced in the body.  · The amount of potassium you need each day depends on your age and any existing medical conditions you may have. Your health care provider or dietitian may recommend an amount of potassium that you should have each day.  This information is not intended to replace advice given to you by your health care provider. Make sure you discuss any questions you have with your health care  provider.  Document Released: 06/21/2005 Document Revised: 02/01/2017 Document Reviewed: 02/01/2017  [employer]vier Interactive Patient Education © 2019 [employer]

## 2019-04-10 ENCOUNTER — Ambulatory Visit: Payer: Self-pay

## 2019-04-10 NOTE — Chronic Care Management (AMB) (Signed)
  Chronic Care Management   Outreach Note  04/10/2019 Name: Kathleen Reed MRN: [id number] DOB: 04-02-1956  Referred by: Dorothyann Peng, MD Reason for referral : Care Coordination   An unsuccessful telephone outreach was attempted today. The patient was referred to the case management team by for assistance with chronic care management and care coordination.   Follow Up Plan: A HIPPA compliant phone message was left for the patient providing contact information and requesting a return call.  The CM team will reach out to the patient again over the next 7-10 days.   Bevelyn Ngo, BSW, CDP [facility] Care Management Social Worker [phone]  Total time spent performing care coordination and/or care management activities with the patient by phone or face to face = 3 minutes.

## 2019-04-15 NOTE — Progress Notes (Signed)
Subjective:     Patient ID: Kathleen Reed , female    DOB: 1956/09/16 , 63 y.o.   MRN: [id number]   Chief Complaint  Patient presents with  . Blood Pressure Check  . Hypothyroidism  . Abdominal Pain    flatulance    HPI  She presents today for f/u elevated blood pressure. She has brought in readings from home. She has readings 120-140s/80-90s. She denies headaches, chest pain and palpitations.   She reports compliance with thyroid meds. She is also here for thyroid check.    Abdominal Pain  This is a new problem. The current episode started 1 to 4 weeks ago. The onset quality is gradual. The pain is located in the epigastric region. The pain is at a severity of 4/10. The pain is mild. The quality of the pain is burning. The abdominal pain radiates to the epigastric region. Associated symptoms include belching. Pertinent negatives include no frequency, headaches, hematochezia or vomiting. The pain is relieved by passing flatus. She has tried nothing for the symptoms. There is no history of abdominal surgery, colon cancer or irritable bowel syndrome.     Past Medical History:  Diagnosis Date  . Breast cyst   . Syncope   . Thyroid disease      Family History  Problem Relation Age of Onset  . Hypertension Mother   . Hyperlipidemia Mother   . Osteoporosis Mother   . Cancer Mother        uterine  . Hyperlipidemia Father   . Hypertension Father   . Cancer Father        colon  . Cancer Sister        breast   . Thyroid disease Sister   . Breast cancer Sister   . Diabetes Maternal Aunt   . Diabetes Maternal Uncle   . Diabetes Maternal Grandmother      Current Outpatient Medications:  .  Cholecalciferol (VITAMIN D3) 1000 UNITS CAPS, Take by mouth., Disp: , Rfl:  .  co-enzyme Q-10 30 MG capsule, Take 100 mg by mouth daily. , Disp: , Rfl:  .  COD LIVER OIL PO, Take by mouth., Disp: , Rfl:  .  Estradiol-Estriol-Progesterone (BIEST/PROGESTERONE) CREA, Place onto the  skin as needed., Disp: , Rfl:  .  magnesium oxide (MAG-OX) 400 MG tablet, Take 400 mg by mouth daily., Disp: , Rfl:  .  Pseudoephedrine HCl (SUDAFED PO), Take by mouth., Disp: , Rfl:  .  thyroid (ARMOUR THYROID) 15 MG tablet, TAKE 1 TABLET BY MOUTH DAILY TAKE WITH 60MG  TABLETS., Disp: 30 tablet, Rfl: 0 .  thyroid (ARMOUR THYROID) 60 MG tablet, TAKE 1 TABLET BY MOUTH ONCE DAILY WITH 15MG  TABLETS., Disp: 30 tablet, Rfl: 0 .  ZYRTEC ALLERGY 10 MG CAPS, Take 1 capsule by mouth daily as needed., Disp: , Rfl:  .  Acetylcysteine (NAC) 600 MG CAPS, Take 1 capsule by mouth daily., Disp: , Rfl:  .  Misc Natural Products (OSTEO BI-FLEX JOINT SHIELD PO), Take 2 tablets by mouth daily. Glucosamine/Chondroitin, Disp: , Rfl:  .  temazepam (RESTORIL) 15 MG capsule, Take 1 capsule (15 mg total) by mouth at bedtime as needed for sleep., Disp: 30 capsule, Rfl: 0   Allergies  Allergen Reactions  . Demerol [Meperidine]     Caused increased blood pressure, redness in face  . Amoxicillin Hives  . Augmentin [Amoxicillin-Pot Clavulanate]   . Penicillins Rash     Review of Systems  Constitutional: Negative.   Respiratory:  Negative.   Cardiovascular: Negative.   Gastrointestinal: Positive for abdominal pain. Negative for hematochezia and vomiting.  Genitourinary: Negative for frequency.  Neurological: Negative.  Negative for headaches.  Psychiatric/Behavioral: Negative.      Today's Vitals   04/09/19 1039  BP: 116/82  Pulse: 69  Temp: 98.5 F (36.9 C)  TempSrc: Oral  Weight: 133 lb 12.8 oz (60.7 kg)  Height: 5\' 5"  (1.651 m)  PainSc: 1   PainLoc: Abdomen   Body mass index is 22.27 kg/m.   Objective:  Physical Exam Vitals signs and nursing note reviewed.  Constitutional:      Appearance: Normal appearance.  HENT:     Head: Normocephalic and atraumatic.  Cardiovascular:     Rate and Rhythm: Normal rate and regular rhythm.     Heart sounds: Normal heart sounds.  Pulmonary:     Effort:  Pulmonary effort is normal.     Breath sounds: Normal breath sounds.  Abdominal:     General: Abdomen is flat. There is no distension.  Skin:    General: Skin is warm.  Neurological:     General: No focal deficit present.     Mental Status: She is alert.  Psychiatric:        Mood and Affect: Mood normal.        Behavior: Behavior normal.         Assessment And Plan:     1. Elevated blood pressure reading  Despite elevated readings, she does not wish to start anti-hypertensive meddications at this time. She is encouraged to take increase her intake of potassium rich foods and to take magnesium nightly. She is reminded breads and cheeses tend to have high sodium levels. She is also encouraged to avoid packaged foods which tend to have high sodium levels. I will reassess at her next visit.   2. Hypothyroidism, unspecified type  I will check thyroid panel and adjust meds as needed.  - TSH; Future - T4, Free; Future  3. Gastroesophageal reflux disease without esophagitis  She is encouraged to consider probiotic therapy. She is also advised to try OTC Prilosec for two weeks, this should help to improve her symptoms.  She will let me know if her symptoms persist. She is also encouraged to avoid foods which are known to exacerbate her symptoms.   4. Primary insomnia  She was given rx temazepam, 15mg  nightly as needed. Importance of good bedtime hygiene was discussed with the patient.   5. Grief at loss of child  [facility]. Pt reminded that hospice offers free grief counseling as well. Pt advised CCM SW will be able to suggest other appropriate services as well.   - Referral to Chronic Care Management Services        Gwynneth Alimentobyn N Gadge Hermiz, MD    THE PATIENT IS ENCOURAGED TO PRACTICE SOCIAL DISTANCING DUE TO THE COVID-19 PANDEMIC.

## 2019-04-16 ENCOUNTER — Other Ambulatory Visit: Payer: Self-pay | Admitting: Internal Medicine

## 2019-04-16 ENCOUNTER — Ambulatory Visit: Payer: Self-pay

## 2019-04-16 DIAGNOSIS — F4321 Adjustment disorder with depressed mood: Secondary | ICD-10-CM

## 2019-04-16 NOTE — Chronic Care Management (AMB) (Signed)
  Care Management Note   Kathleen Reed is a 63 y.o. year old female who is a primary care patient of [name], MD . The CM team was consulted for assistance with Mental Health Counseling and Resources.   Review of patient status, including review of consultants reports, rand collaboration with appropriate care team members and the patient's provider was performed as part of comprehensive patient evaluation and provision of chronic care management services. Telephone outreach to patient today to introduce CCM services.   I reached out to Kathleen Reed by phone today.   Kathleen Reed was given information about Chronic Care Management services today including:  1. CCM service includes personalized support from designated clinical staff supervised by her physician, including individualized plan of care and coordination with other care providers 2. 24/7 contact phone numbers for assistance for urgent and routine care needs. 3. Service will only be billed when office clinical staff spend 20 minutes or more in a month to coordinate care. 4. Only one practitioner may furnish and bill the service in a calendar month. 5. The patient may stop CCM services at any time (effective at the end of the month) by phone call to the office staff. 6. The patient will be responsible for cost sharing (co-pay) of up to 20% of the service fee (after annual deductible is met).   Patient did not agree to services and does not wish to consider at this time. The patient reports financial strain due to being out of work and is unable to identify clinical goals she would like to work on except mental health support. SW educated the patient on her mental health benefit and offered to assist the patient in contacting her health plan's mental health hotline. The patient declined during today's call due to being busy with a home project. SW encouraged the patient to outreach this number in the coming days to  identify in network providers close to home who could assist with clinical support.    Follow Up Plan: SW has outreached a co-worker in hopes of identifying close to home resources for the patient. SW will follow up with the patient over the next 7-10 days regarding identified resources.   Daneen Schick, BSW, CDP Social Worker, Certified Dementia Practitioner [facility] [phone]  Total time spent performing care coordination and/or care management activities with the patient by phone or face to face = 15 minutes.

## 2019-04-18 ENCOUNTER — Ambulatory Visit: Payer: Self-pay

## 2019-04-18 DIAGNOSIS — F4321 Adjustment disorder with depressed mood: Secondary | ICD-10-CM

## 2019-04-18 DIAGNOSIS — Z634 Disappearance and death of family member: Secondary | ICD-10-CM

## 2019-04-18 NOTE — Chronic Care Management (AMB) (Signed)
  Chronic Care Management   Social Work Note  04/18/2019 Name: Kathleen Reed MRN: [id number] DOB: 1956/04/17  Kathleen Reed is a 63 y.o. year old female who sees Dorothyann Peng, MD for primary care. The CCM team was consulted for assistance with Mental Health Counseling and Resources.   SW mailed resource information to the patient regarding local grief support groups obtained from [employer].  Follow Up Plan: SW will follow up with patient by phone over the next two weeks to review resource.  Bevelyn Ngo, BSW, CDP Social Worker, Certified Dementia Practitioner [facility] [phone]  Total time spent performing care coordination and/or care management activities with the patient by phone or face to face = 5 minutes.

## 2019-04-30 ENCOUNTER — Ambulatory Visit: Payer: Self-pay

## 2019-04-30 DIAGNOSIS — F4321 Adjustment disorder with depressed mood: Secondary | ICD-10-CM

## 2019-04-30 DIAGNOSIS — Z634 Disappearance and death of family member: Secondary | ICD-10-CM

## 2019-04-30 NOTE — Chronic Care Management (AMB) (Signed)
  Care Management   Follow Up Note   04/30/2019 Name: KIMMERLY LORA MRN: [id number] DOB: May 11, 1956  Referred by: [name], MD Reason for referral : Care Coordination   NALAYSIA MANGANIELLO is a 63 y.o. year old female who is a primary care patient of [name], MD. The care management team was consulted for assistance with grief support at the loss of a child.  I placed a follow up call to the patient to confirm receipt of mailed resource. The patient has received the resource but has yet to outreach. The patient does not want assistance with intervention during today's call but reports doing well. "I'm on a vacation and it is just what I needed".   No follow up call planned at this time. CCM SW encouraged the patient to contact this Probation officer for further resource information or if assistance with linking to resources is needed. The patient verbalized understanding.  Daneen Schick, BSW, CDP Social Worker, Certified Dementia Practitioner [facility] [phone]  Total time spent performing care coordination and/or care management activities with the patient by phone or face to face = 8 minutes.

## 2019-05-16 ENCOUNTER — Other Ambulatory Visit: Payer: Self-pay | Admitting: Internal Medicine

## 2019-05-21 ENCOUNTER — Other Ambulatory Visit: Payer: Medicare Other

## 2019-05-21 ENCOUNTER — Other Ambulatory Visit: Payer: Self-pay

## 2019-05-21 DIAGNOSIS — E039 Hypothyroidism, unspecified: Secondary | ICD-10-CM

## 2019-05-22 LAB — T4, FREE: Free T4: 0.98 ng/dL (ref 0.82–1.77)

## 2019-05-22 LAB — TSH: TSH: 2.75 u[IU]/mL (ref 0.450–4.500)

## 2019-06-14 LAB — HM COLONOSCOPY

## 2019-06-18 ENCOUNTER — Encounter: Payer: Self-pay | Admitting: Internal Medicine

## 2019-07-15 ENCOUNTER — Other Ambulatory Visit: Payer: Self-pay

## 2019-07-15 ENCOUNTER — Ambulatory Visit
Admission: EM | Admit: 2019-07-15 | Discharge: 2019-07-15 | Disposition: A | Discharge status: Home / Self Care | Payer: Medicare Other | Attending: Emergency Medicine | Admitting: Emergency Medicine

## 2019-07-15 DIAGNOSIS — N939 Abnormal uterine and vaginal bleeding, unspecified: Secondary | ICD-10-CM

## 2019-07-15 DIAGNOSIS — R1031 Right lower quadrant pain: Secondary | ICD-10-CM

## 2019-07-15 DIAGNOSIS — R102 Pelvic and perineal pain: Secondary | ICD-10-CM

## 2019-07-15 LAB — POCT URINALYSIS DIP (MANUAL ENTRY)
Bilirubin, UA: NEGATIVE
Blood, UA: NEGATIVE
Glucose, UA: NEGATIVE mg/dL
Ketones, POC UA: NEGATIVE mg/dL
Leukocytes, UA: NEGATIVE
Nitrite, UA: NEGATIVE
Protein Ur, POC: NEGATIVE mg/dL
Spec Grav, UA: 1.01 (ref 1.010–1.025)
Urobilinogen, UA: 0.2 E.U./dL
pH, UA: 6.5 (ref 5.0–8.0)

## 2019-07-15 NOTE — Discharge Instructions (Signed)
°  You may take 500mg  acetaminophen every 4-6 hours or in combination with ibuprofen 400-600mg  every 6-8 hours as needed for pain and inflammation.    Please call to schedule a follow up appointment with your family doctor later this week for further evaluation and treatment of symptoms. Your family doctor may recommend a pelvic ultrasound for further evaluation of your symptoms.  Call 911 or go to the hospital if your symptoms are becoming more severe- worsening pain, bleeding or you develop new symptoms- fever, vomiting, urinary symptoms.
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
## 2019-07-15 NOTE — ED Triage Notes (Signed)
Pt describes vaginal pain that is worse with intercourse, has longtime monogamous partner. Also has some lower abdominal pain radiating to back

## 2019-07-17 ENCOUNTER — Other Ambulatory Visit: Payer: Self-pay

## 2019-07-17 ENCOUNTER — Ambulatory Visit (HOSPITAL_COMMUNITY)
Admission: RE | Admit: 2019-07-17 | Discharge: 2019-07-17 | Disposition: A | Discharge status: Home / Self Care | Payer: Medicare Other | Source: Ambulatory Visit | Attending: Cardiovascular Disease | Admitting: Cardiovascular Disease

## 2019-07-17 DIAGNOSIS — I6523 Occlusion and stenosis of bilateral carotid arteries: Secondary | ICD-10-CM

## 2019-07-17 LAB — CERVICOVAGINAL ANCILLARY ONLY
Bacterial vaginitis: NEGATIVE
Candida vaginitis: NEGATIVE
Chlamydia: NEGATIVE
Neisseria Gonorrhea: NEGATIVE
Trichomonas: NEGATIVE

## 2019-08-27 ENCOUNTER — Encounter: Payer: Self-pay | Admitting: Internal Medicine

## 2019-08-28 ENCOUNTER — Other Ambulatory Visit: Payer: Self-pay

## 2019-08-28 ENCOUNTER — Ambulatory Visit: Payer: Medicare Other | Admitting: Internal Medicine

## 2019-08-28 ENCOUNTER — Encounter: Payer: Self-pay | Admitting: Internal Medicine

## 2019-08-28 VITALS — BP 140/72 | HR 81 | Temp 97.7°F | Ht 64.6 in | Wt 137.6 lb

## 2019-08-28 DIAGNOSIS — B029 Zoster without complications: Secondary | ICD-10-CM

## 2019-08-28 DIAGNOSIS — R030 Elevated blood-pressure reading, without diagnosis of hypertension: Secondary | ICD-10-CM | POA: Diagnosis not present

## 2019-08-28 DIAGNOSIS — E039 Hypothyroidism, unspecified: Secondary | ICD-10-CM

## 2019-08-28 DIAGNOSIS — N951 Menopausal and female climacteric states: Secondary | ICD-10-CM | POA: Diagnosis not present

## 2019-08-28 MED ORDER — ACYCLOVIR 5 % EX OINT: TOPICAL_OINTMENT | CUTANEOUS | 0 refills | Status: DC

## 2019-08-28 MED ORDER — VALACYCLOVIR HCL 1 G PO TABS: 1000.0000 mg | ORAL_TABLET | Freq: Three times a day (TID) | ORAL | 0 refills | Status: AC

## 2019-08-28 NOTE — Progress Notes (Signed)
Subjective:     Patient ID: Kathleen Reed , female    DOB: 1956/09/25 , 63 y.o.   MRN: [id number]   Chief Complaint  Patient presents with  . Hypertension  . Rash    on back-a week    HPI  She is here today for a bp check. She is not taking any bp medication. She does not wish to take bp medication. She denies headaches, chest pain and visual disturbances.   Rash This is a new problem. The current episode started in the past 7 days. The problem is unchanged. The affected locations include the back. The rash is characterized by blistering, burning and redness. She was exposed to nothing. Pertinent negatives include no congestion, cough, diarrhea, eye pain, fatigue or joint pain. Past treatments include nothing.     Past Medical History:  Diagnosis Date  . Breast cyst   . Syncope   . Thyroid disease      Family History  Problem Relation Age of Onset  . Hypertension Mother   . Hyperlipidemia Mother   . Osteoporosis Mother   . Cancer Mother        uterine  . Hyperlipidemia Father   . Hypertension Father   . Cancer Father        colon  . Cancer Sister        breast   . Thyroid disease Sister   . Breast cancer Sister   . Diabetes Maternal Aunt   . Diabetes Maternal Uncle   . Diabetes Maternal Grandmother      Current Outpatient Medications:  .  Acetylcysteine (NAC) 600 MG CAPS, Take 1 capsule by mouth daily., Disp: , Rfl:  .  Cholecalciferol (VITAMIN D3) 1000 UNITS CAPS, Take by mouth., Disp: , Rfl:  .  co-enzyme Q-10 30 MG capsule, Take 100 mg by mouth daily. , Disp: , Rfl:  .  COD LIVER OIL PO, Take by mouth., Disp: , Rfl:  .  Estradiol-Estriol-Progesterone (BIEST/PROGESTERONE) CREA, Place onto the skin as needed., Disp: , Rfl:  .  magnesium oxide (MAG-OX) 400 MG tablet, Take 400 mg by mouth daily., Disp: , Rfl:  .  Misc Natural Products (OSTEO BI-FLEX JOINT SHIELD PO), Take 2 tablets by mouth daily. Glucosamine/Chondroitin, Disp: , Rfl:  .  Pseudoephedrine  HCl (SUDAFED PO), Take by mouth., Disp: , Rfl:  .  temazepam (RESTORIL) 15 MG capsule, Take 1 capsule (15 mg total) by mouth at bedtime as needed for sleep., Disp: 30 capsule, Rfl: 0 .  thyroid (ARMOUR THYROID) 15 MG tablet, TAKE 1 TABLET BY MOUTH DAILY TAKE WITH 60MG TABLETS., Disp: 30 tablet, Rfl: 0 .  thyroid (ARMOUR THYROID) 60 MG tablet, TAKE 1 TABLET BY MOUTH ONCE DAILY WITH 15MG TABLETS., Disp: 30 tablet, Rfl: 0 .  ZYRTEC ALLERGY 10 MG CAPS, Take 1 capsule by mouth daily as needed., Disp: , Rfl:  .  acyclovir ointment (ZOVIRAX) 5 %, Apply thin layer to affected area tid prn, Disp: 15 g, Rfl: 0 .  valACYclovir (VALTREX) 1000 MG tablet, Take 1 tablet (1,000 mg total) by mouth 3 (three) times daily for 1 day., Disp: 21 tablet, Rfl: 0   Allergies  Allergen Reactions  . Demerol [Meperidine]     Caused increased blood pressure, redness in face  . Amoxicillin Hives  . Augmentin [Amoxicillin-Pot Clavulanate]   . Penicillins Rash     Review of Systems  Constitutional: Negative.  Negative for fatigue.  HENT: Negative for congestion.   Eyes:  Negative for pain.  Respiratory: Negative.  Negative for cough.   Cardiovascular: Negative.   Gastrointestinal: Negative.  Negative for diarrhea.  Musculoskeletal: Negative for joint pain.  Skin: Positive for rash.  Neurological: Negative.   Psychiatric/Behavioral: Negative.      Today's Vitals   08/28/19 1116  BP: 140/72  Pulse: 81  Temp: 97.7 F (36.5 C)  TempSrc: Oral  SpO2: 98%  Weight: 137 lb 9.6 oz (62.4 kg)  Height: 5' 4.6" (1.641 m)   Body mass index is 23.18 kg/m.   Objective:  Physical Exam Vitals signs and nursing note reviewed.  Constitutional:      Appearance: Normal appearance.  HENT:     Head: Normocephalic and atraumatic.  Cardiovascular:     Rate and Rhythm: Normal rate and regular rhythm.     Heart sounds: Normal heart sounds.  Pulmonary:     Effort: Pulmonary effort is normal.     Breath sounds: Normal breath  sounds.  Skin:    General: Skin is warm.     Findings: Rash present. Rash is crusting and vesicular.          Comments: She has vesicular lesion on right lower back.   Neurological:     General: No focal deficit present.     Mental Status: She is alert.  Psychiatric:        Mood and Affect: Mood normal.        Behavior: Behavior normal.         Assessment And Plan:     1. Elevated blood pressure reading  Uncontrolled. She declines treatment. She prefers to work on diet/exercise. She acknowledges that she has not been diligent regarding lifestyle changes in the recent past. I will reassess at her next visit.   - CMP14+EGFR  2. Herpes zoster without complication  Acute. She was given rx valacyclovir 1gm tid x 7 days. She is encouraged to complete full course. She was also given rx acyclovir ointment to apply to affected areas qid prn. She is encouraged to call me next week to update me on her progress.   3. 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 She would like to stay on current meds. She agrees to saliva test early 2021.   4. Hypothyroidism, unspecified type  I will check thyroid panel and adjust meds as needed.  - TSH - T4, Free        Maximino Greenland, MD    THE PATIENT IS ENCOURAGED TO PRACTICE SOCIAL DISTANCING DUE TO THE COVID-19 PANDEMIC.

## 2019-08-28 NOTE — Patient Instructions (Signed)
Shingles  Shingles is an infection. It gives you a painful skin rash and blisters that have fluid in them. Shingles is caused by the same germ (virus) that causes chickenpox. Shingles only happens in people who:  Have had chickenpox.  Have been given a shot of medicine (vaccine) to protect against chickenpox. Shingles is rare in this group. The first symptoms of shingles may be itching, tingling, or pain in an area on your skin. A rash will show on your skin a few days or weeks later. The rash is likely to be on one side of your body. The rash usually has a shape like a belt or a band. Over time, the rash turns into fluid-filled blisters. The blisters will break open, change into scabs, and dry up. Medicines may:  Help with pain and itching.  Help you get better sooner.  Help to prevent long-term problems. Follow these instructions at home: Medicines  Take over-the-counter and prescription medicines only as told by your doctor.  Put on an anti-itch cream or numbing cream where you have a rash, blisters, or scabs. Do this as told by your doctor. Helping with itching and discomfort   Put cold, wet cloths (cold compresses) on the area of the rash or blisters as told by your doctor.  Cool baths can help you feel better. Try adding baking soda or dry oatmeal to the water to lessen itching. Do not bathe in hot water. Blister and rash care  Keep your rash covered with a loose bandage (dressing).  Wear loose clothing that does not rub on your rash.  Keep your rash and blisters clean. To do this, wash the area with mild soap and cool water as told by your doctor.  Check your rash every day for signs of infection. Check for: ? More redness, swelling, or pain. ? Fluid or blood. ? Warmth. ? Pus or a bad smell.  Do not scratch your rash. Do not pick at your blisters. To help you to not scratch: ? Keep your fingernails clean and cut short. ? Wear gloves or mittens when you sleep, if  scratching is a problem. General instructions  Rest as told by your doctor.  Keep all follow-up visits as told by your doctor. This is important.  Wash your hands often with soap and water. If soap and water are not available, use hand sanitizer. Doing this lowers your chance of getting a skin infection caused by germs (bacteria).  Your infection can cause chickenpox in people who have never had chickenpox or never got a shot of chickenpox vaccine. If you have blisters that did not change into scabs yet, try not to touch other people or be around other people, especially: ? Babies. ? Pregnant women. ? Children who have areas of red, itchy, or rough skin (eczema). ? Very old people who have transplants. ? People who have a long-term (chronic) sickness, like cancer or AIDS. Contact a doctor if:  Your pain does not get better with medicine.  Your pain does not get better after the rash heals.  You have any signs of infection in the rash area. These signs include: ? More redness, swelling, or pain around the rash. ? Fluid or blood coming from the rash. ? The rash area feeling warm to the touch. ? Pus or a bad smell coming from the rash. Get help right away if:  The rash is on your face or nose.  You have pain in your face or pain by   your eye.  You lose feeling on one side of your face.  You have trouble seeing.  You have ear pain, or you have ringing in your ear.  You have a loss of taste.  Your condition gets worse. Summary  Shingles gives you a painful skin rash and blisters that have fluid in them.  Shingles is an infection. It is caused by the same germ (virus) that causes chickenpox.  Keep your rash covered with a loose bandage (dressing). Wear loose clothing that does not rub on your rash.  If you have blisters that did not change into scabs yet, try not to touch other people or be around people. This information is not intended to replace advice given to you by  your health care provider. Make sure you discuss any questions you have with your health care provider. Document Released: 04/25/2008 Document Revised: 03/01/2019 Document Reviewed: 07/12/2017 Elsevier Patient Education  2020 [employer]  

## 2019-08-29 LAB — T4, FREE: Free T4: 1.01 ng/dL (ref 0.82–1.77)

## 2019-08-29 LAB — CMP14+EGFR
ALT: 14 IU/L (ref 0–32)
AST: 22 IU/L (ref 0–40)
Albumin/Globulin Ratio: 2.1 (ref 1.2–2.2)
Albumin: 4.5 g/dL (ref 3.8–4.8)
Alkaline Phosphatase: 72 IU/L (ref 39–117)
BUN/Creatinine Ratio: 19 (ref 12–28)
BUN: 16 mg/dL (ref 8–27)
Bilirubin Total: 0.3 mg/dL (ref 0.0–1.2)
CO2: 25 mmol/L (ref 20–29)
Calcium: 9.7 mg/dL (ref 8.7–10.3)
Chloride: 103 mmol/L (ref 96–106)
Creatinine, Ser: 0.85 mg/dL (ref 0.57–1.00)
GFR calc Af Amer: 83 mL/min/{1.73_m2} (ref >59)
GFR calc non Af Amer: 72 mL/min/{1.73_m2} (ref >59)
Globulin, Total: 2.1 g/dL (ref 1.5–4.5)
Glucose: 83 mg/dL (ref 65–99)
Potassium: 5.2 mmol/L (ref 3.5–5.2)
Sodium: 142 mmol/L (ref 134–144)
Total Protein: 6.6 g/dL (ref 6.0–8.5)

## 2019-08-29 LAB — TSH: TSH: 1.58 u[IU]/mL (ref 0.450–4.500)

## 2019-09-02 ENCOUNTER — Encounter: Payer: Self-pay | Admitting: Internal Medicine

## 2019-09-10 ENCOUNTER — Encounter: Payer: Self-pay | Admitting: Internal Medicine

## 2019-09-16 ENCOUNTER — Other Ambulatory Visit: Payer: Self-pay | Admitting: Internal Medicine

## 2019-10-08 ENCOUNTER — Encounter: Payer: Self-pay | Admitting: Internal Medicine

## 2019-11-12 ENCOUNTER — Other Ambulatory Visit: Payer: Self-pay | Admitting: Internal Medicine

## 2019-11-12 MED ORDER — TEMAZEPAM 15 MG PO CAPS: ORAL_CAPSULE | ORAL | 1 refills | Status: DC

## 2019-12-04 ENCOUNTER — Other Ambulatory Visit: Payer: Self-pay | Admitting: Internal Medicine

## 2019-12-16 ENCOUNTER — Ambulatory Visit: Payer: Medicare PPO | Attending: Internal Medicine

## 2019-12-16 ENCOUNTER — Other Ambulatory Visit: Payer: Self-pay

## 2019-12-16 DIAGNOSIS — Z20822 Contact with and (suspected) exposure to covid-19: Secondary | ICD-10-CM | POA: Diagnosis not present

## 2019-12-17 ENCOUNTER — Ambulatory Visit
Admission: EM | Admit: 2019-12-17 | Discharge: 2019-12-17 | Disposition: A | Discharge status: Home / Self Care | Payer: Medicare PPO | Attending: Emergency Medicine | Admitting: Emergency Medicine

## 2019-12-17 ENCOUNTER — Other Ambulatory Visit: Payer: Self-pay

## 2019-12-17 ENCOUNTER — Encounter: Payer: Self-pay | Admitting: Emergency Medicine

## 2019-12-17 DIAGNOSIS — K219 Gastro-esophageal reflux disease without esophagitis: Secondary | ICD-10-CM | POA: Diagnosis not present

## 2019-12-17 DIAGNOSIS — Z20822 Contact with and (suspected) exposure to covid-19: Secondary | ICD-10-CM | POA: Diagnosis not present

## 2019-12-17 LAB — NOVEL CORONAVIRUS, NAA: SARS-CoV-2, NAA: NOT DETECTED

## 2019-12-17 MED ORDER — FLUTICASONE PROPIONATE 50 MCG/ACT NA SUSP: 1.0000 | Freq: Every day | NASAL | 0 refills | Status: DC

## 2019-12-17 MED ORDER — PANTOPRAZOLE SODIUM 20 MG PO TBEC: 20.0000 mg | DELAYED_RELEASE_TABLET | Freq: Every day | ORAL | 0 refills | Status: DC
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
## 2019-12-17 NOTE — Discharge Instructions (Addendum)
You should remain isolated in your home for 10 days from symptom onset AND greater than 72 hours after symptoms resolution (absence of fever without the use of fever-reducing medication and improvement in respiratory symptoms), whichever is longer Get plenty of rest and push fluids Protonix for acid reflux Flonase prescribed for nasal congestion and runny nose Use medications daily for symptom relief Use OTC medications like ibuprofen or tylenol as needed fever or pain Call or go to the ED if you have any new or worsening symptoms such as fever, worsening cough, shortness of breath, chest tightness, chest pain, turning blue, changes in mental status, etc..Marland Kitchen

## 2019-12-17 NOTE — ED Triage Notes (Signed)
Patient states that she began with sore throat x 2 weeks ago, then an episode of acid reflux last thursday and then sinus congestion, and sinus headache, no cough, fatigue. Tested for covid yesterday, waiting on results.

## 2019-12-18 ENCOUNTER — Other Ambulatory Visit: Payer: Self-pay

## 2019-12-18 ENCOUNTER — Encounter: Payer: Self-pay | Admitting: Internal Medicine

## 2019-12-18 ENCOUNTER — Telehealth (INDEPENDENT_AMBULATORY_CARE_PROVIDER_SITE_OTHER): Payer: Medicare PPO | Admitting: Internal Medicine

## 2019-12-18 VITALS — BP 119/70 | HR 77 | Ht 64.6 in

## 2019-12-18 DIAGNOSIS — R1011 Right upper quadrant pain: Secondary | ICD-10-CM

## 2019-12-18 DIAGNOSIS — K219 Gastro-esophageal reflux disease without esophagitis: Secondary | ICD-10-CM
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
## 2019-12-18 NOTE — Patient Instructions (Addendum)
COVID-19 Vaccine Information can be found at: [email or web address] For questions related to vaccine distribution or appointments, please email [email or web address] or call [phone].    Food Choices for Gastroesophageal Reflux Disease, Adult When you have gastroesophageal reflux disease (GERD), the foods you eat and your eating habits are very important. Choosing the right foods can help ease your discomfort. Think about working with a nutrition specialist (dietitian) to help you make good choices. What are tips for following this plan?  Meals  Choose healthy foods that are low in fat, such as fruits, vegetables, whole grains, low-fat dairy products, and lean meat, fish, and poultry.  Eat small meals often instead of 3 large meals a day. Eat your meals slowly, and in a place where you are relaxed. Avoid bending over or lying down until 2-3 hours after eating.  Avoid eating meals 2-3 hours before bed.  Avoid drinking a lot of liquid with meals.  Cook foods using methods other than frying. Bake, grill, or broil food instead.  Avoid or limit: ? Chocolate. ? Peppermint or spearmint. ? Alcohol. ? Pepper. ? Black and decaffeinated coffee. ? Black and decaffeinated tea. ? Bubbly (carbonated) soft drinks. ? Caffeinated energy drinks and soft drinks.  Limit high-fat foods such as: ? Fatty meat or fried foods. ? Whole milk, cream, butter, or ice cream. ? Nuts and nut butters. ? Pastries, donuts, and sweets made with butter or shortening.  Avoid foods that cause symptoms. These foods may be different for everyone. Common foods that cause symptoms include: ? Tomatoes. ? Oranges, lemons, and limes. ? Peppers. ? Spicy food. ? Onions and garlic. ? Vinegar. Lifestyle  Maintain a healthy weight. Ask your doctor what weight is healthy for you. If you need to lose weight, work with your doctor to do so safely.  Exercise for at  least 30 minutes for 5 or more days each week, or as told by your doctor.  Wear loose-fitting clothes.  Do not smoke. If you need help quitting, ask your doctor.  Sleep with the head of your bed higher than your feet. Use a wedge under the mattress or blocks under the bed frame to raise the head of the bed. Summary  When you have gastroesophageal reflux disease (GERD), food and lifestyle choices are very important in easing your symptoms.  Eat small meals often instead of 3 large meals a day. Eat your meals slowly, and in a place where you are relaxed.  Limit high-fat foods such as fatty meat or fried foods.  Avoid bending over or lying down until 2-3 hours after eating.  Avoid peppermint and spearmint, caffeine, alcohol, and chocolate. This information is not intended to replace advice given to you by your health care provider. Make sure you discuss any questions you have with your health care provider. Document Revised: 02/28/2019 Document Reviewed: 12/13/2016 Elsevier Patient Education  2020 Elsevier Inc.  

## 2019-12-19 ENCOUNTER — Other Ambulatory Visit: Payer: Self-pay | Admitting: Internal Medicine

## 2019-12-19 NOTE — Telephone Encounter (Signed)
Refill request BI-EST 5MG /ML

## 2019-12-23 ENCOUNTER — Other Ambulatory Visit: Payer: Self-pay

## 2019-12-23 ENCOUNTER — Ambulatory Visit
Admission: EM | Admit: 2019-12-23 | Discharge: 2019-12-23 | Disposition: A | Discharge status: Home / Self Care | Payer: Medicare PPO | Attending: Emergency Medicine | Admitting: Emergency Medicine

## 2019-12-23 ENCOUNTER — Telehealth: Payer: Self-pay

## 2019-12-23 DIAGNOSIS — R1013 Epigastric pain: Secondary | ICD-10-CM

## 2019-12-23 DIAGNOSIS — K219 Gastro-esophageal reflux disease without esophagitis: Secondary | ICD-10-CM

## 2019-12-23 MED ORDER — ALUM & MAG HYDROXIDE-SIMETH 200-200-20 MG/5ML PO SUSP
30.0000 mL | Freq: Once | ORAL | Status: AC
  Administered 2019-12-23: 30 mL via ORAL
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
## 2019-12-23 NOTE — Discharge Instructions (Signed)
Unable to rule out GI bleed in urgent care setting.  Offered patient further evaluation and management in the ED.  Patient declines at this time and would like to try outpatient therapy first.  Aware of the risk associated with this decision including missed diagnosis, organ damage, organ failure, and/or death.  Patient aware and in agreement.     GI cocktail given in office Increased Protonix to 40 mg daily Avoid eating 2-3 hours before bed Elevate head of bed.  Avoid chocolate, caffeine, alcohol, onion, and mint prior to bed.  This relaxes the bottom part of your esophagus and can make your symptoms worse.  Follow up with PCP regarding ultrasound and blood work.  Follow up GI Return or go to the ED if you have any new or worsening symptoms

## 2019-12-23 NOTE — Telephone Encounter (Signed)
The pt was told that DR. Allyne Gee said that she can take 2 of the protonix and the pt said that she went to urgent care today and that they gave her a GI cocktail and told her she could take 2 protonix.  The pt said that she has a f/u with Dr. Marge Duncans PA.

## 2019-12-23 NOTE — ED Triage Notes (Signed)
Pt presents to UC w/ c/o acid reflux x3 weeks. Pt was seen last week for same issue and pt states the prescribed meds have not helped. It is abt the same. Pt c/o extreme fatigue when acid reflux hits. Pt states it feels burning in abdomen and stabbing pain in epigastric area.

## 2019-12-30 ENCOUNTER — Ambulatory Visit: Payer: Medicare PPO | Admitting: Internal Medicine

## 2019-12-30 ENCOUNTER — Encounter: Payer: Self-pay | Admitting: Internal Medicine

## 2019-12-30 ENCOUNTER — Other Ambulatory Visit: Payer: Self-pay

## 2019-12-30 VITALS — BP 114/66 | HR 77 | Temp 97.6°F | Ht 64.6 in | Wt 135.2 lb

## 2019-12-30 DIAGNOSIS — E2839 Other primary ovarian failure: Secondary | ICD-10-CM | POA: Diagnosis not present

## 2019-12-30 DIAGNOSIS — R1011 Right upper quadrant pain: Secondary | ICD-10-CM | POA: Diagnosis not present

## 2019-12-30 DIAGNOSIS — K219 Gastro-esophageal reflux disease without esophagitis: Secondary | ICD-10-CM | POA: Diagnosis not present

## 2019-12-30 DIAGNOSIS — N951 Menopausal and female climacteric states: Secondary | ICD-10-CM | POA: Diagnosis not present

## 2019-12-30 DIAGNOSIS — Z1231 Encounter for screening mammogram for malignant neoplasm of breast: Secondary | ICD-10-CM | POA: Diagnosis not present

## 2019-12-30 DIAGNOSIS — R002 Palpitations: Secondary | ICD-10-CM | POA: Diagnosis not present

## 2019-12-30 DIAGNOSIS — E039 Hypothyroidism, unspecified: Secondary | ICD-10-CM

## 2019-12-30 DIAGNOSIS — R1310 Dysphagia, unspecified: Secondary | ICD-10-CM | POA: Diagnosis not present

## 2019-12-30 DIAGNOSIS — R0789 Other chest pain: Secondary | ICD-10-CM | POA: Diagnosis not present

## 2019-12-30 NOTE — Patient Instructions (Signed)

## 2019-12-30 NOTE — Progress Notes (Signed)
This visit occurred during the SARS-CoV-2 public health emergency.  Safety protocols were in place, including screening questions prior to the visit, additional usage of staff PPE, and extensive cleaning of exam room while observing appropriate contact time as indicated for disinfecting solutions.  Subjective:     Patient ID: Kathleen Reed , female    DOB: [birth year] , 64 y.o.   MRN: [id number]   Chief Complaint  Patient presents with  . hormones f/u    HPI  She is here today for f/u BHRT.   She reports compliance with medication. She had to miss four days due to delay in getting refill. She did experience headaches and hot flashes during that time. Her sx have since resolved as well. She adds that she cut back on progesterone cream due to weight gain, she is down to one click per day.     Past Medical History:  Diagnosis Date  . Breast cyst   . Syncope   . Thyroid disease      Family History  Problem Relation Age of Onset  . Hypertension Mother   . Hyperlipidemia Mother   . Osteoporosis Mother   . Cancer Mother        uterine  . Hyperlipidemia Father   . Hypertension Father   . Cancer Father        colon  . Cancer Sister        breast   . Thyroid disease Sister   . Breast cancer Sister   . Diabetes Maternal Aunt   . Diabetes Maternal Uncle   . Diabetes Maternal Grandmother      Current Outpatient Medications:  .  Acetylcysteine (NAC) 600 MG CAPS, Take 1 capsule by mouth daily., Disp: , Rfl:  .  Cholecalciferol (VITAMIN D3) 1000 UNITS CAPS, Take by mouth., Disp: , Rfl:  .  co-enzyme Q-10 30 MG capsule, Take 100 mg by mouth daily. , Disp: , Rfl:  .  COD LIVER OIL PO, Take by mouth., Disp: , Rfl:  .  Estradiol Micronized POWD, Propylene Glycol LIQD, HRT Heavy CREA, Estriol POWD, APPLY 0.50 ML TO 1 ML (2 TO 4 CLICKS) TOPICALLY ONCE DAILY. (HOLD ON SUNDAY), Disp: 30 g, Rfl: 0 .  Estradiol-Estriol-Progesterone (BIEST/PROGESTERONE) CREA, Place onto the skin as  needed., Disp: , Rfl:  .  fluticasone (FLONASE) 50 MCG/ACT nasal spray, Place 1 spray into both nostrils daily for 14 days., Disp: 16 g, Rfl: 0 .  magnesium oxide (MAG-OX) 400 MG tablet, Take 400 mg by mouth daily., Disp: , Rfl:  .  pantoprazole (PROTONIX) 20 MG tablet, Take 1 tablet (20 mg total) by mouth daily., Disp: 30 tablet, Rfl: 0 .  Pseudoephedrine HCl (SUDAFED PO), Take by mouth., Disp: , Rfl:  .  thyroid (ARMOUR THYROID) 15 MG tablet, TAKE 1 TABLET BY MOUTH DAILY TAKE WITH 60MG TABLETS., Disp: 30 tablet, Rfl: 0 .  thyroid (ARMOUR THYROID) 60 MG tablet, TAKE 1 TABLET BY MOUTH ONCE DAILY WITH 15MG TABLETS., Disp: 30 tablet, Rfl: 0 .  ZYRTEC ALLERGY 10 MG CAPS, Take 1 capsule by mouth daily as needed., Disp: , Rfl:    Allergies  Allergen Reactions  . Demerol [Meperidine]     Caused increased blood pressure, redness in face  . Amoxicillin Hives  . Augmentin [Amoxicillin-Pot Clavulanate]   . Penicillins Rash     Review of Systems  Constitutional: Negative.   Respiratory: Negative.   Cardiovascular: Positive for chest pain and palpitations.  Has intermittent sharp, stabbing cp associated with palpitations. No assocx diaphoresis.   Gastrointestinal: Positive for abdominal distention, abdominal pain and nausea.       She is still having abdominal discomfort despite use of pantoprazole. Has appt with GI later today (virtual). Has yet to hear about RUQ u/s.   Neurological: Negative.   Psychiatric/Behavioral: Negative.      Today's Vitals   12/30/19 1059  BP: 114/66  Pulse: 77  Temp: 97.6 F (36.4 C)  TempSrc: Oral  Weight: 135 lb 3.2 oz (61.3 kg)  Height: 5' 4.6" (1.641 m)  PainSc: 3   PainLoc: Throat   Body mass index is 22.78 kg/m.   Objective:  Physical Exam Vitals and nursing note reviewed.  Constitutional:      Appearance: Normal appearance.  HENT:     Head: Normocephalic and atraumatic.  Cardiovascular:     Rate and Rhythm: Normal rate and regular  rhythm.     Heart sounds: Normal heart sounds.  Pulmonary:     Effort: Pulmonary effort is normal.     Breath sounds: Normal breath sounds.  Abdominal:     General: Abdomen is flat. Bowel sounds are normal.     Palpations: Abdomen is soft.     Tenderness: There is abdominal tenderness.     Comments: Epigastric and RUQ tenderness to palpation  Skin:    General: Skin is warm.  Neurological:     General: No focal deficit present.     Mental Status: She is alert.  Psychiatric:        Mood and Affect: Mood normal.        Behavior: Behavior normal.         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 She has had an improvement in her symptoms with use of BHRT  She will continue with one click Biest/progesterone nightly except Sundays. She will rto in four months for re-evaluation. She agrees to partial kit prior to next visit.   2. Hypothyroidism, unspecified type  I will check thyroid panel and adjust meds as needed.  - TSH - T4, Free  3. Atypical chest pain  Pt advised that cardiac disease sometimes presents itself as GI sx in women. She agreed to EKG today.  EKG performed, no new changes noted. It is likely her sx are due to uncontrolled GERD.   - EKG 12-Lead  4. RUQ pain  Pt has yet to hear about RUQ ultrasound. I will have referral specialist contact Forestine Na regarding this appt.   5. Palpitations  Recurrent. She is encouraged to contact her cardiologist for further evaluation. I will check labs as listed below and make medication changes as needed.   - TSH - T4, Free - Magnesium - BMP8+EGFR  6. Estrogen deficiency  She agrees to scheduling bone density.   - DG Bone Density  7. Encounter for screening mammogram for malignant neoplasm of breast  - MM Digital Screening; Future        Maximino Greenland, MD    THE PATIENT IS ENCOURAGED TO PRACTICE SOCIAL DISTANCING DUE TO THE COVID-19 PANDEMIC.

## 2019-12-31 LAB — BMP8+EGFR
BUN/Creatinine Ratio: 12 (ref 12–28)
BUN: 11 mg/dL (ref 8–27)
CO2: 27 mmol/L (ref 20–29)
Calcium: 9.6 mg/dL (ref 8.7–10.3)
Chloride: 101 mmol/L (ref 96–106)
Creatinine, Ser: 0.92 mg/dL (ref 0.57–1.00)
GFR calc Af Amer: 76 mL/min/{1.73_m2} (ref >59)
GFR calc non Af Amer: 66 mL/min/{1.73_m2} (ref >59)
Glucose: 81 mg/dL (ref 65–99)
Potassium: 4.7 mmol/L (ref 3.5–5.2)
Sodium: 140 mmol/L (ref 134–144)

## 2019-12-31 LAB — T4, FREE: Free T4: 1 ng/dL (ref 0.82–1.77)

## 2019-12-31 LAB — MAGNESIUM: Magnesium: 2.4 mg/dL — ABNORMAL HIGH (ref 1.6–2.3)

## 2019-12-31 LAB — TSH: TSH: 1.66 u[IU]/mL (ref 0.450–4.500)

## 2019-12-31 LAB — T3, FREE: T3, Free: 4.1 pg/mL (ref 2.0–4.4)

## 2020-01-03 ENCOUNTER — Other Ambulatory Visit: Payer: Self-pay | Admitting: Physician Assistant

## 2020-01-03 ENCOUNTER — Other Ambulatory Visit: Payer: Self-pay

## 2020-01-03 ENCOUNTER — Ambulatory Visit (HOSPITAL_COMMUNITY)
Admission: RE | Admit: 2020-01-03 | Discharge: 2020-01-03 | Disposition: A | Discharge status: Home / Self Care | Payer: Medicare PPO | Source: Ambulatory Visit | Attending: Internal Medicine | Admitting: Internal Medicine

## 2020-01-03 DIAGNOSIS — R1011 Right upper quadrant pain: Secondary | ICD-10-CM | POA: Diagnosis not present

## 2020-01-03 DIAGNOSIS — R1310 Dysphagia, unspecified: Secondary | ICD-10-CM

## 2020-01-04 ENCOUNTER — Emergency Department (HOSPITAL_COMMUNITY)
Admission: EM | Admit: 2020-01-04 | Discharge: 2020-01-04 | Disposition: A | Discharge status: Home / Self Care | Payer: Medicare PPO | Attending: Emergency Medicine | Admitting: Emergency Medicine

## 2020-01-04 ENCOUNTER — Other Ambulatory Visit: Payer: Self-pay

## 2020-01-04 ENCOUNTER — Encounter (HOSPITAL_COMMUNITY): Payer: Self-pay | Admitting: *Deleted

## 2020-01-04 ENCOUNTER — Emergency Department (HOSPITAL_COMMUNITY): Payer: Medicare PPO

## 2020-01-04 DIAGNOSIS — Z79899 Other long term (current) drug therapy: Secondary | ICD-10-CM | POA: Diagnosis not present

## 2020-01-04 DIAGNOSIS — E039 Hypothyroidism, unspecified: Secondary | ICD-10-CM | POA: Diagnosis not present

## 2020-01-04 DIAGNOSIS — R55 Syncope and collapse: Secondary | ICD-10-CM | POA: Insufficient documentation

## 2020-01-04 DIAGNOSIS — Z87891 Personal history of nicotine dependence: Secondary | ICD-10-CM | POA: Insufficient documentation

## 2020-01-04 LAB — COMPREHENSIVE METABOLIC PANEL
ALT: 16 U/L (ref 0–44)
AST: 19 U/L (ref 15–41)
Albumin: 3.8 g/dL (ref 3.5–5.0)
Alkaline Phosphatase: 70 U/L (ref 38–126)
Anion gap: 10 (ref 5–15)
BUN: 17 mg/dL (ref 8–23)
CO2: 24 mmol/L (ref 22–32)
Calcium: 8.7 mg/dL — ABNORMAL LOW (ref 8.9–10.3)
Chloride: 104 mmol/L (ref 98–111)
Creatinine, Ser: 0.98 mg/dL (ref 0.44–1.00)
GFR calc Af Amer: >60 mL/min (ref >60)
GFR calc non Af Amer: >60 mL/min (ref >60)
Glucose, Bld: 157 mg/dL — ABNORMAL HIGH (ref 70–99)
Potassium: 3.6 mmol/L (ref 3.5–5.1)
Sodium: 138 mmol/L (ref 135–145)
Total Bilirubin: 0.3 mg/dL (ref 0.3–1.2)
Total Protein: 6.3 g/dL — ABNORMAL LOW (ref 6.5–8.1)

## 2020-01-04 LAB — CBC WITH DIFFERENTIAL/PLATELET
Abs Immature Granulocytes: 0.01 10*3/uL (ref 0.00–0.07)
Basophils Absolute: 0 10*3/uL (ref 0.0–0.1)
Basophils Relative: 1 %
Eosinophils Absolute: 0.2 10*3/uL (ref 0.0–0.5)
Eosinophils Relative: 3 %
HCT: 36.8 % (ref 36.0–46.0)
Hemoglobin: 12.3 g/dL (ref 12.0–15.0)
Immature Granulocytes: 0 %
Lymphocytes Relative: 33 %
Lymphs Abs: 2 10*3/uL (ref 0.7–4.0)
MCH: 31.8 pg (ref 26.0–34.0)
MCHC: 33.4 g/dL (ref 30.0–36.0)
MCV: 95.1 fL (ref 80.0–100.0)
Monocytes Absolute: 0.5 10*3/uL (ref 0.1–1.0)
Monocytes Relative: 8 %
Neutro Abs: 3.4 10*3/uL (ref 1.7–7.7)
Neutrophils Relative %: 55 %
Platelets: 250 10*3/uL (ref 150–400)
RBC: 3.87 MIL/uL (ref 3.87–5.11)
RDW: 12.2 % (ref 11.5–15.5)
WBC: 6.1 10*3/uL (ref 4.0–10.5)
nRBC: 0 % (ref 0.0–0.2)

## 2020-01-04 LAB — URINALYSIS, ROUTINE W REFLEX MICROSCOPIC
Bacteria, UA: NONE SEEN
Bilirubin Urine: NEGATIVE
Glucose, UA: NEGATIVE mg/dL
Hgb urine dipstick: NEGATIVE
Ketones, ur: NEGATIVE mg/dL
Nitrite: NEGATIVE
Protein, ur: NEGATIVE mg/dL
Specific Gravity, Urine: 1.006 (ref 1.005–1.030)
pH: 7 (ref 5.0–8.0)
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
## 2020-01-04 NOTE — Discharge Instructions (Addendum)
The testing today is reassuring.  There is no sign of serious problems, that can lead to seizures or other abnormalities.  Since you do not have a formal diagnosis of seizure, we are referring you to a neurologist to get further evaluation and treatment.  In the meantime avoid driving a car, climbing ladders or being in a bathtub alone; to help protect yourself and others.  Return here, if needed, for problems.

## 2020-01-04 NOTE — ED Triage Notes (Signed)
Pt states that she was at home, went to look out the window, started to feel nauseous and felt as if she was going to pass out, pt has hx of syncopal episodes vs seizure type episodes, did bite her tongue today. Denies hitting her head. Pt alert,able to answer questions,

## 2020-01-07 ENCOUNTER — Ambulatory Visit: Payer: Medicare PPO | Admitting: Nurse Practitioner

## 2020-01-07 ENCOUNTER — Other Ambulatory Visit: Payer: Self-pay

## 2020-01-07 ENCOUNTER — Encounter: Payer: Self-pay | Admitting: Nurse Practitioner

## 2020-01-07 VITALS — BP 114/80 | HR 81 | Temp 98.6°F | Ht 65.2 in | Wt 131.4 lb

## 2020-01-07 DIAGNOSIS — R55 Syncope and collapse: Secondary | ICD-10-CM | POA: Diagnosis not present

## 2020-01-07 NOTE — Progress Notes (Signed)
This visit occurred during the SARS-CoV-2 public health emergency.  Safety protocols were in place, including screening questions prior to the visit, additional usage of staff PPE, and extensive cleaning of exam room while observing appropriate contact time as indicated for disinfecting solutions.  Subjective:     Patient ID: Kathleen Reed , female    DOB: [birth year] , 64 y.o.   MRN: [id number]   Chief Complaint  Patient presents with  . ER F/U    patient stated she has had a headache that comes and goes and she has felt a little shakey     HPI  She went to the ER  4-5 spells of "blackouts", had a bad one on Saturday.  Ended up on the floor and did not know what was coming on.  She began walking to find her granddaughter. Her granddaughter said "she thought she was having a seizure", when she regained consciousness she was staring.  She had not been sleeping well since having chest pains.  She felt nauseated and her peripheral vision goes first, "I felt it coming".  She has been trying to be more safe with changing positions.  She also feels like an elephant is sitting around her waist. She is taking protonix for the GI symptoms.    Past Medical History:  Diagnosis Date  . Breast cyst   . Syncope   . Thyroid disease      Family History  Problem Relation Age of Onset  . Hypertension Mother   . Hyperlipidemia Mother   . Osteoporosis Mother   . Cancer Mother        uterine  . Hyperlipidemia Father   . Hypertension Father   . Cancer Father        colon  . Cancer Sister        breast   . Thyroid disease Sister   . Breast cancer Sister   . Diabetes Maternal Aunt   . Diabetes Maternal Uncle   . Diabetes Maternal Grandmother      Current Outpatient Medications:  .  pantoprazole (PROTONIX) 40 MG tablet, Take 40 mg by mouth daily., Disp: , Rfl:  .  thyroid (ARMOUR THYROID) 15 MG tablet, TAKE 1 TABLET BY MOUTH DAILY TAKE WITH 60MG  TABLETS., Disp: 30 tablet, Rfl: 0 .   thyroid (ARMOUR THYROID) 60 MG tablet, TAKE 1 TABLET BY MOUTH ONCE DAILY WITH 15MG  TABLETS., Disp: 30 tablet, Rfl: 0 .  Acetylcysteine (NAC) 600 MG CAPS, Take 1 capsule by mouth daily., Disp: , Rfl:  .  Cholecalciferol (VITAMIN D3) 1000 UNITS CAPS, Take by mouth., Disp: , Rfl:  .  co-enzyme Q-10 30 MG capsule, Take 100 mg by mouth daily. , Disp: , Rfl:  .  COD LIVER OIL PO, Take by mouth., Disp: , Rfl:  .  Estradiol Micronized POWD, Propylene Glycol LIQD, HRT Heavy CREA, Estriol POWD, APPLY 0.50 ML TO 1 ML (2 TO 4 CLICKS) TOPICALLY ONCE DAILY. (HOLD ON SUNDAY) (Patient not taking: Reported on 01/07/2020), Disp: 30 g, Rfl: 0 .  Estradiol-Estriol-Progesterone (BIEST/PROGESTERONE) CREA, Place onto the skin as needed., Disp: , Rfl:  .  fluticasone (FLONASE) 50 MCG/ACT nasal spray, Place 1 spray into both nostrils daily for 14 days., Disp: 16 g, Rfl: 0 .  magnesium oxide (MAG-OX) 400 MG tablet, Take 400 mg by mouth daily., Disp: , Rfl:  .  pantoprazole (PROTONIX) 20 MG tablet, Take 1 tablet (20 mg total) by mouth daily. (Patient not taking: Reported on 01/07/2020), Disp:  30 tablet, Rfl: 0 .  Pseudoephedrine HCl (SUDAFED PO), Take by mouth., Disp: , Rfl:  .  ZYRTEC ALLERGY 10 MG CAPS, Take 1 capsule by mouth daily as needed., Disp: , Rfl:    Allergies  Allergen Reactions  . Demerol [Meperidine]     Caused increased blood pressure, redness in face  . Amoxicillin Hives  . Augmentin [Amoxicillin-Pot Clavulanate]   . Penicillins Rash     Review of Systems  Constitutional: Negative.   Respiratory: Negative.   Cardiovascular: Negative.  Negative for chest pain, palpitations and leg swelling.  Neurological: Positive for syncope. Negative for dizziness, seizures and headaches.  Psychiatric/Behavioral: Negative.      Today's Vitals   01/07/20 1144  BP: 114/80  Pulse: 81  Temp: 98.6 F (37 C)  Weight: 131 lb 6.4 oz (59.6 kg)  Height: 5' 5.2" (1.656 m)  PainSc: 3   PainLoc: Head   Body mass  index is 21.73 kg/m.   Objective:  Physical Exam Constitutional:      General: She is not in acute distress.    Appearance: Normal appearance.  Cardiovascular:     Rate and Rhythm: Normal rate and regular rhythm.     Pulses: Normal pulses.     Heart sounds: Normal heart sounds. No murmur.  Pulmonary:     Effort: Pulmonary effort is normal. No respiratory distress.     Breath sounds: Normal breath sounds.  Skin:    General: Skin is warm and dry.     Capillary Refill: Capillary refill takes less than 2 seconds.  Neurological:     General: No focal deficit present.     Mental Status: She is alert and oriented to person, place, and time.     Cranial Nerves: No cranial nerve deficit.     Sensory: No sensory deficit.     Motor: No weakness.  Psychiatric:        Mood and Affect: Mood normal.        Behavior: Behavior normal.        Thought Content: Thought content normal.        Judgment: Judgment normal.         Assessment And Plan:   1. Syncope, unspecified syncope type  This has been an ongoing problem, she has seen Dr Anne Hahn in the past and due to reoccurring will refer back to him for re-evaluation as these episodes could be seizures. - Ambulatory referral to Neurology  Arnette Felts, FNP    THE PATIENT IS ENCOURAGED TO PRACTICE SOCIAL DISTANCING DUE TO THE COVID-19 PANDEMIC.

## 2020-01-08 ENCOUNTER — Telehealth: Payer: Self-pay

## 2020-01-08 NOTE — Telephone Encounter (Signed)
-----   Message from Dorothyann Peng, MD sent at 01/03/2020 11:02 AM EST ----- Abdominal u/s: gallbladder polyp is noted. NO gallstones identified.   What medication changes did GI suggest for you?

## 2020-01-08 NOTE — Telephone Encounter (Signed)
I left the pt a message that I was calling to get the responses to the questions that Dr. Allyne Gee had sent the pt with the results of her abdominal ultrasound.

## 2020-01-10 ENCOUNTER — Other Ambulatory Visit: Payer: Medicare PPO

## 2020-01-14 ENCOUNTER — Other Ambulatory Visit: Payer: Self-pay | Admitting: Internal Medicine

## 2020-01-15 DIAGNOSIS — K219 Gastro-esophageal reflux disease without esophagitis: Secondary | ICD-10-CM | POA: Diagnosis not present

## 2020-01-15 DIAGNOSIS — R110 Nausea: Secondary | ICD-10-CM | POA: Diagnosis not present

## 2020-01-15 DIAGNOSIS — R1013 Epigastric pain: Secondary | ICD-10-CM | POA: Diagnosis not present

## 2020-01-27 DIAGNOSIS — Z1159 Encounter for screening for other viral diseases: Secondary | ICD-10-CM | POA: Diagnosis not present

## 2020-01-29 DIAGNOSIS — M9905 Segmental and somatic dysfunction of pelvic region: Secondary | ICD-10-CM | POA: Diagnosis not present

## 2020-01-29 DIAGNOSIS — M9901 Segmental and somatic dysfunction of cervical region: Secondary | ICD-10-CM | POA: Diagnosis not present

## 2020-01-29 DIAGNOSIS — M9903 Segmental and somatic dysfunction of lumbar region: Secondary | ICD-10-CM | POA: Diagnosis not present

## 2020-01-29 DIAGNOSIS — M9902 Segmental and somatic dysfunction of thoracic region: Secondary | ICD-10-CM | POA: Diagnosis not present

## 2020-01-29 DIAGNOSIS — M545 Low back pain: Secondary | ICD-10-CM | POA: Diagnosis not present

## 2020-01-30 DIAGNOSIS — R1310 Dysphagia, unspecified: Secondary | ICD-10-CM | POA: Diagnosis not present

## 2020-01-30 DIAGNOSIS — K2970 Gastritis, unspecified, without bleeding: Secondary | ICD-10-CM | POA: Diagnosis not present

## 2020-01-30 DIAGNOSIS — R12 Heartburn: Secondary | ICD-10-CM | POA: Diagnosis not present

## 2020-01-30 DIAGNOSIS — K219 Gastro-esophageal reflux disease without esophagitis: Secondary | ICD-10-CM | POA: Diagnosis not present

## 2020-01-30 DIAGNOSIS — R1013 Epigastric pain: Secondary | ICD-10-CM | POA: Diagnosis not present

## 2020-02-03 DIAGNOSIS — M9901 Segmental and somatic dysfunction of cervical region: Secondary | ICD-10-CM | POA: Diagnosis not present

## 2020-02-03 DIAGNOSIS — M545 Low back pain: Secondary | ICD-10-CM | POA: Diagnosis not present

## 2020-02-03 DIAGNOSIS — K2970 Gastritis, unspecified, without bleeding: Secondary | ICD-10-CM | POA: Diagnosis not present

## 2020-02-03 DIAGNOSIS — M9902 Segmental and somatic dysfunction of thoracic region: Secondary | ICD-10-CM | POA: Diagnosis not present

## 2020-02-03 DIAGNOSIS — M9905 Segmental and somatic dysfunction of pelvic region: Secondary | ICD-10-CM | POA: Diagnosis not present

## 2020-02-03 DIAGNOSIS — K219 Gastro-esophageal reflux disease without esophagitis: Secondary | ICD-10-CM | POA: Diagnosis not present

## 2020-02-03 DIAGNOSIS — M9903 Segmental and somatic dysfunction of lumbar region: Secondary | ICD-10-CM | POA: Diagnosis not present

## 2020-02-06 ENCOUNTER — Ambulatory Visit
Admission: RE | Admit: 2020-02-06 | Discharge: 2020-02-06 | Disposition: A | Discharge status: Home / Self Care | Payer: Medicare PPO | Source: Ambulatory Visit | Attending: Internal Medicine | Admitting: Internal Medicine

## 2020-02-06 ENCOUNTER — Other Ambulatory Visit: Payer: Self-pay

## 2020-02-06 DIAGNOSIS — Z1231 Encounter for screening mammogram for malignant neoplasm of breast: Secondary | ICD-10-CM

## 2020-02-11 ENCOUNTER — Other Ambulatory Visit: Payer: Self-pay | Admitting: Internal Medicine

## 2020-02-17 DIAGNOSIS — M9905 Segmental and somatic dysfunction of pelvic region: Secondary | ICD-10-CM | POA: Diagnosis not present

## 2020-02-17 DIAGNOSIS — M545 Low back pain: Secondary | ICD-10-CM | POA: Diagnosis not present

## 2020-02-17 DIAGNOSIS — M9903 Segmental and somatic dysfunction of lumbar region: Secondary | ICD-10-CM | POA: Diagnosis not present

## 2020-02-17 DIAGNOSIS — M9902 Segmental and somatic dysfunction of thoracic region: Secondary | ICD-10-CM | POA: Diagnosis not present

## 2020-02-17 DIAGNOSIS — M9901 Segmental and somatic dysfunction of cervical region: Secondary | ICD-10-CM | POA: Diagnosis not present

## 2020-02-18 ENCOUNTER — Encounter: Payer: Self-pay | Admitting: Neurology

## 2020-02-18 ENCOUNTER — Ambulatory Visit: Payer: Medicare PPO | Admitting: Neurology

## 2020-02-18 ENCOUNTER — Other Ambulatory Visit: Payer: Self-pay

## 2020-02-18 VITALS — BP 134/82 | HR 86 | Temp 98.4°F | Ht 65.0 in | Wt 140.0 lb

## 2020-02-18 DIAGNOSIS — R55 Syncope and collapse: Secondary | ICD-10-CM
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
## 2020-02-26 DIAGNOSIS — K2970 Gastritis, unspecified, without bleeding: Secondary | ICD-10-CM | POA: Diagnosis not present

## 2020-02-26 DIAGNOSIS — K824 Cholesterolosis of gallbladder: Secondary | ICD-10-CM | POA: Diagnosis not present

## 2020-02-26 DIAGNOSIS — K219 Gastro-esophageal reflux disease without esophagitis: Secondary | ICD-10-CM | POA: Diagnosis not present

## 2020-02-27 ENCOUNTER — Encounter: Payer: Self-pay | Admitting: Internal Medicine

## 2020-02-27 ENCOUNTER — Ambulatory Visit (INDEPENDENT_AMBULATORY_CARE_PROVIDER_SITE_OTHER): Payer: Medicare PPO | Admitting: Internal Medicine

## 2020-02-27 ENCOUNTER — Encounter: Payer: BC Managed Care – PPO | Admitting: Internal Medicine

## 2020-02-27 ENCOUNTER — Other Ambulatory Visit: Payer: Self-pay

## 2020-02-27 VITALS — BP 118/76 | HR 72 | Temp 98.1°F | Ht 65.0 in | Wt 139.6 lb

## 2020-02-27 DIAGNOSIS — N951 Menopausal and female climacteric states: Secondary | ICD-10-CM | POA: Diagnosis not present

## 2020-02-27 DIAGNOSIS — M5431 Sciatica, right side: Secondary | ICD-10-CM

## 2020-02-27 DIAGNOSIS — R7309 Other abnormal glucose: Secondary | ICD-10-CM

## 2020-02-27 DIAGNOSIS — Z0000 Encounter for general adult medical examination without abnormal findings: Secondary | ICD-10-CM | POA: Diagnosis not present

## 2020-02-27 DIAGNOSIS — E039 Hypothyroidism, unspecified: Secondary | ICD-10-CM | POA: Diagnosis not present

## 2020-02-27 DIAGNOSIS — H9192 Unspecified hearing loss, left ear: Secondary | ICD-10-CM | POA: Diagnosis not present

## 2020-02-27 DIAGNOSIS — R233 Spontaneous ecchymoses: Secondary | ICD-10-CM

## 2020-02-27 LAB — POCT URINALYSIS DIPSTICK
Bilirubin, UA: NEGATIVE
Blood, UA: NEGATIVE
Glucose, UA: NEGATIVE
Ketones, UA: NEGATIVE
Leukocytes, UA: NEGATIVE
Nitrite, UA: NEGATIVE
Protein, UA: NEGATIVE
Spec Grav, UA: 1.01 (ref 1.010–1.025)
Urobilinogen, UA: 0.2 E.U./dL
pH, UA: 7 (ref 5.0–8.0)

## 2020-02-27 NOTE — Patient Instructions (Signed)
  Ms. Kathleen Reed , Thank you for taking time to come for your Medicare Wellness Visit. I appreciate your ongoing commitment to your health goals. Please review the following plan we discussed and let me know if I can assist you in the future.   These are the goals we discussed: Goals    . DIET - REDUCE SUGAR INTAKE       This is a list of the screening recommended for you and due dates:  Health Maintenance  Topic Date Due  . DEXA scan (bone density measurement)  05/05/2019  . Pneumonia vaccines (1 of 2 - PCV13) 08/27/2020*  . HIV Screening  12/29/2020*  . Flu Shot  06/21/2020  . Mammogram  02/05/2022  . Colon Cancer Screening  06/13/2024  . Tetanus Vaccine  02/25/2029  .  Hepatitis C: One time screening is recommended by Center for Disease Control  (CDC) for  adults born from [birth year] through 1967.   Completed  *Topic was postponed. The date shown is not the original due date.
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
## 2020-03-03 ENCOUNTER — Encounter: Payer: Self-pay | Admitting: Internal Medicine

## 2020-03-04 ENCOUNTER — Encounter: Payer: Self-pay | Admitting: Internal Medicine

## 2020-03-10 LAB — T4, FREE: Free T4: 0.93 ng/dL (ref 0.82–1.77)

## 2020-03-10 LAB — BMP8+EGFR
BUN/Creatinine Ratio: 15 (ref 12–28)
BUN: 14 mg/dL (ref 8–27)
CO2: 26 mmol/L (ref 20–29)
Calcium: 9.6 mg/dL (ref 8.7–10.3)
Chloride: 101 mmol/L (ref 96–106)
Creatinine, Ser: 0.96 mg/dL (ref 0.57–1.00)
GFR calc Af Amer: 72 mL/min/{1.73_m2} (ref >59)
GFR calc non Af Amer: 62 mL/min/{1.73_m2} (ref >59)
Glucose: 77 mg/dL (ref 65–99)
Potassium: 4.6 mmol/L (ref 3.5–5.2)
Sodium: 139 mmol/L (ref 134–144)

## 2020-03-10 LAB — ESTRADIOL, FREE
Estradiol, Serum, MS: 1.9 pg/mL
Free Estradiol, Percent: 1.7 %
Free Estradiol, Serum: 0.03 pg/mL — ABNORMAL LOW

## 2020-03-10 LAB — CBC WITH DIFFERENTIAL/PLATELET
Basophils Absolute: 0 10*3/uL (ref 0.0–0.2)
Basos: 1 %
EOS (ABSOLUTE): 0.1 10*3/uL (ref 0.0–0.4)
Eos: 2 %
Hematocrit: 40.4 % (ref 34.0–46.6)
Hemoglobin: 13.6 g/dL (ref 11.1–15.9)
Immature Grans (Abs): 0 10*3/uL (ref 0.0–0.1)
Immature Granulocytes: 0 %
Lymphocytes Absolute: 1.6 10*3/uL (ref 0.7–3.1)
Lymphs: 29 %
MCH: 32.2 pg (ref 26.6–33.0)
MCHC: 33.7 g/dL (ref 31.5–35.7)
MCV: 96 fL (ref 79–97)
Monocytes Absolute: 0.5 10*3/uL (ref 0.1–0.9)
Monocytes: 8 %
Neutrophils Absolute: 3.4 10*3/uL (ref 1.4–7.0)
Neutrophils: 60 %
Platelets: 246 10*3/uL (ref 150–450)
RBC: 4.23 x10E6/uL (ref 3.77–5.28)
RDW: 12.3 % (ref 11.7–15.4)
WBC: 5.7 10*3/uL (ref 3.4–10.8)

## 2020-03-10 LAB — LIPID PANEL
Chol/HDL Ratio: 3.8 ratio (ref 0.0–4.4)
Cholesterol, Total: 279 mg/dL — ABNORMAL HIGH (ref 100–199)
HDL: 74 mg/dL (ref >39)
LDL Chol Calc (NIH): 194 mg/dL — ABNORMAL HIGH (ref 0–99)
Triglycerides: 71 mg/dL (ref 0–149)
VLDL Cholesterol Cal: 11 mg/dL (ref 5–40)

## 2020-03-10 LAB — PROGESTERONE: Progesterone: 2.6 ng/mL

## 2020-03-10 LAB — TSH: TSH: 9.23 u[IU]/mL — ABNORMAL HIGH (ref 0.450–4.500)

## 2020-03-10 LAB — HEMOGLOBIN A1C
Est. average glucose Bld gHb Est-mCnc: 105 mg/dL
Hgb A1c MFr Bld: 5.3 % (ref 4.8–5.6)

## 2020-03-13 DIAGNOSIS — M9905 Segmental and somatic dysfunction of pelvic region: Secondary | ICD-10-CM | POA: Diagnosis not present

## 2020-03-13 DIAGNOSIS — M545 Low back pain: Secondary | ICD-10-CM | POA: Diagnosis not present

## 2020-03-13 DIAGNOSIS — M9902 Segmental and somatic dysfunction of thoracic region: Secondary | ICD-10-CM | POA: Diagnosis not present

## 2020-03-13 DIAGNOSIS — M9903 Segmental and somatic dysfunction of lumbar region: Secondary | ICD-10-CM | POA: Diagnosis not present

## 2020-03-13 DIAGNOSIS — M9901 Segmental and somatic dysfunction of cervical region: Secondary | ICD-10-CM | POA: Diagnosis not present

## 2020-03-15 ENCOUNTER — Encounter: Payer: Self-pay | Admitting: Internal Medicine

## 2020-03-20 ENCOUNTER — Ambulatory Visit
Admission: RE | Admit: 2020-03-20 | Discharge: 2020-03-20 | Disposition: A | Discharge status: Home / Self Care | Payer: Medicare PPO | Source: Ambulatory Visit | Attending: Internal Medicine | Admitting: Internal Medicine

## 2020-03-20 ENCOUNTER — Other Ambulatory Visit: Payer: Self-pay

## 2020-03-20 DIAGNOSIS — Z780 Asymptomatic menopausal state: Secondary | ICD-10-CM | POA: Diagnosis not present

## 2020-03-26 DIAGNOSIS — Z7189 Other specified counseling: Secondary | ICD-10-CM | POA: Insufficient documentation
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
## 2020-03-27 ENCOUNTER — Other Ambulatory Visit: Payer: Self-pay

## 2020-03-27 ENCOUNTER — Encounter: Payer: Self-pay | Admitting: Cardiology

## 2020-03-27 ENCOUNTER — Ambulatory Visit: Payer: Medicare PPO | Admitting: Cardiology

## 2020-03-27 VITALS — BP 110/74 | HR 64 | Ht 65.0 in | Wt 141.4 lb

## 2020-03-27 DIAGNOSIS — I447 Left bundle-branch block, unspecified: Secondary | ICD-10-CM

## 2020-03-27 DIAGNOSIS — Z7189 Other specified counseling: Secondary | ICD-10-CM

## 2020-03-27 DIAGNOSIS — R55 Syncope and collapse: Secondary | ICD-10-CM

## 2020-03-27 DIAGNOSIS — I6523 Occlusion and stenosis of bilateral carotid arteries: Secondary | ICD-10-CM

## 2020-03-27 NOTE — Patient Instructions (Signed)
Medication Instructions:  No changes *If you need a refill on your cardiac medications before your next appointment, please call your pharmacy*  Lab Work: None ordered this visit  Testing/Procedures: None ordered this visit  Follow-Up: At [facility], you and your health needs are our priority.  As part of our continuing mission to provide you with exceptional heart care, we have created designated Provider Care Teams.  These Care Teams include your primary Cardiologist (physician) and Advanced Practice Providers (APPs -  Physician Assistants and Nurse Practitioners) who all work together to provide you with the care you need, when you need it.  Your next appointment:   12 month(s)  You will receive a reminder letter in the mail two months in advance. If you don't receive a letter, please call our office to schedule the follow-up appointment.  The format for your next appointment:   In Person  Provider:   Rollene Rotunda, MD  Other Instructions Referred to electrophysiology - Dr. Clide Cliff

## 2020-04-13 ENCOUNTER — Ambulatory Visit (HOSPITAL_COMMUNITY): Payer: Medicare PPO

## 2020-04-18 IMAGING — MG DIGITAL SCREENING BILAT W/ CAD
4 series · 4 of 4 positions shown · non-contrast
Comparison: Previous exam(s).

CLINICAL DATA: Screening.

EXAM:
DIGITAL SCREENING BILATERAL MAMMOGRAM WITH CAD

[L MLO]
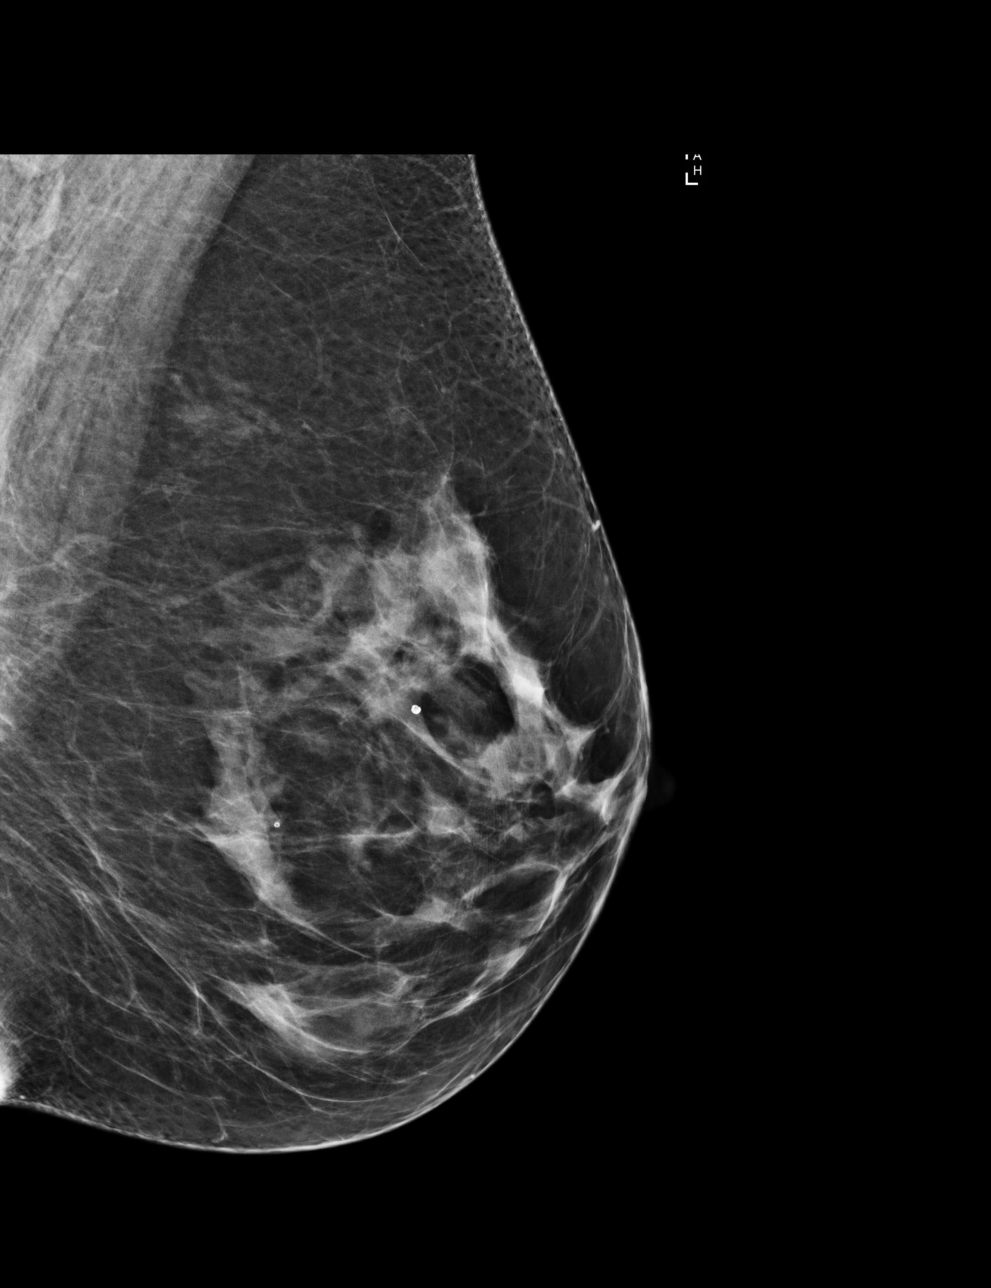

[L CC]
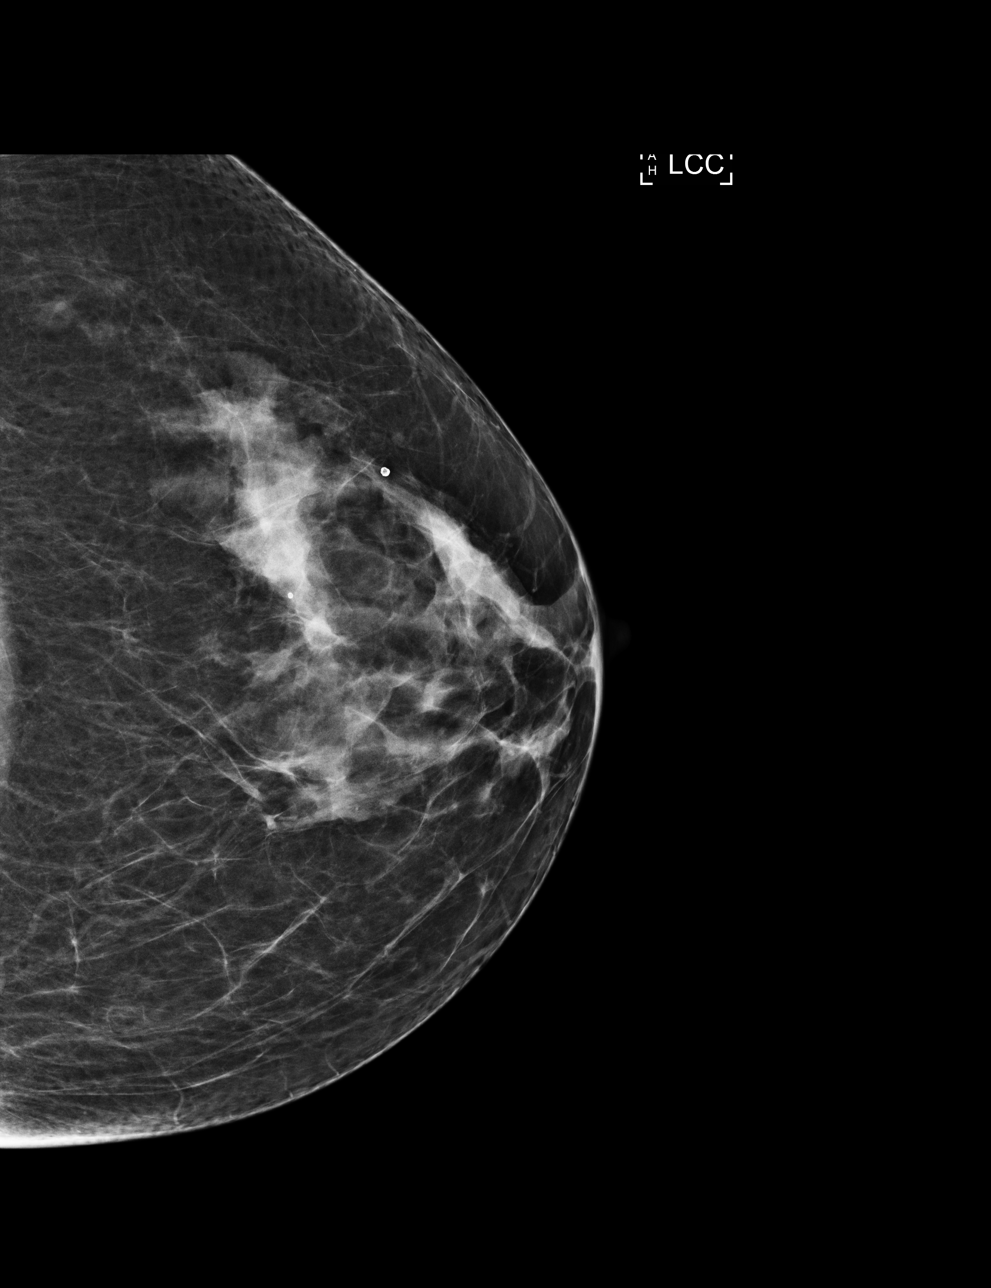

[R CC]
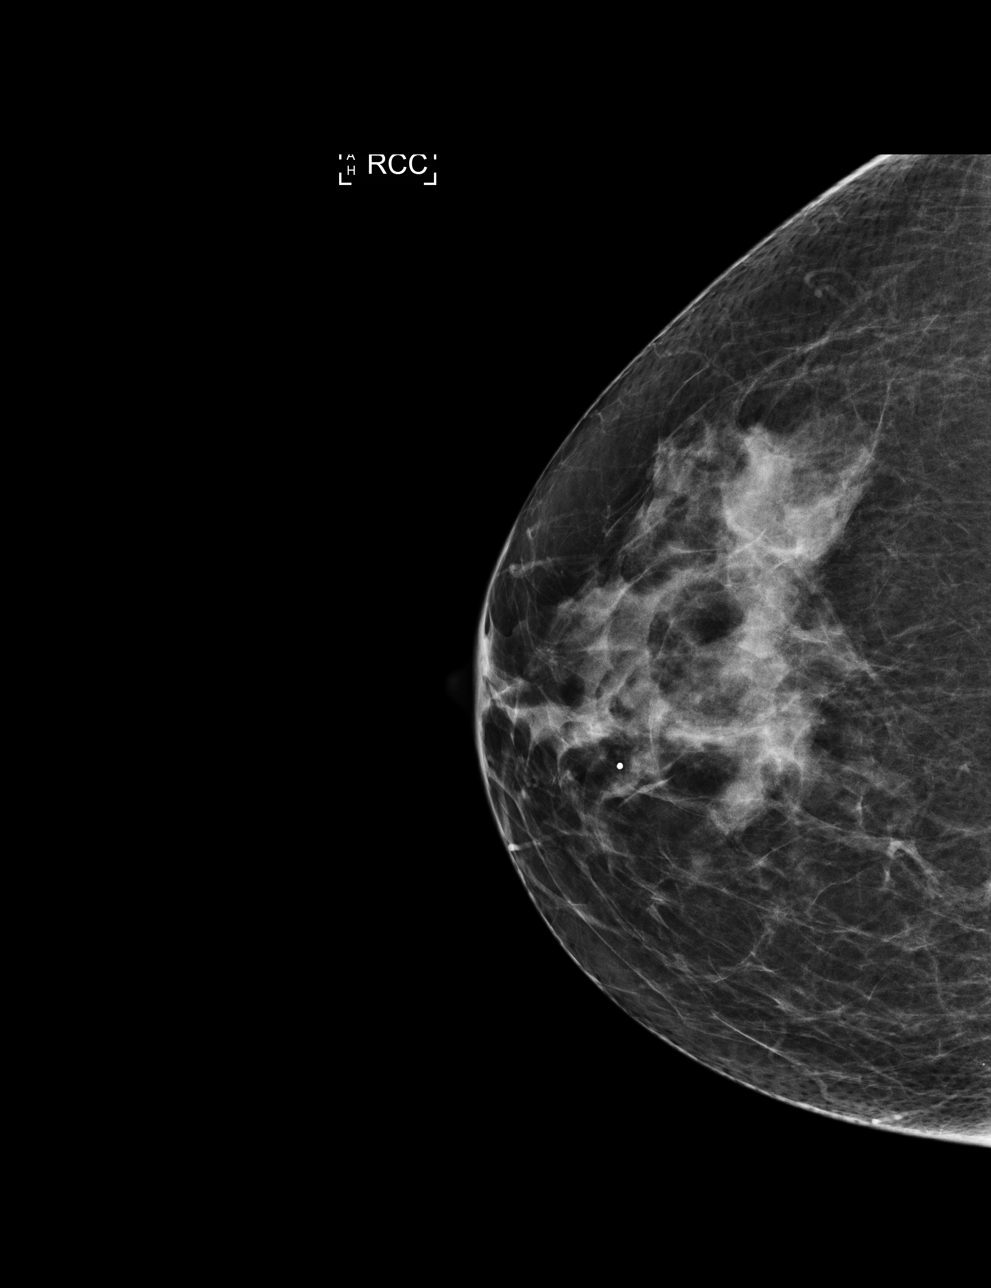

[R MLO]
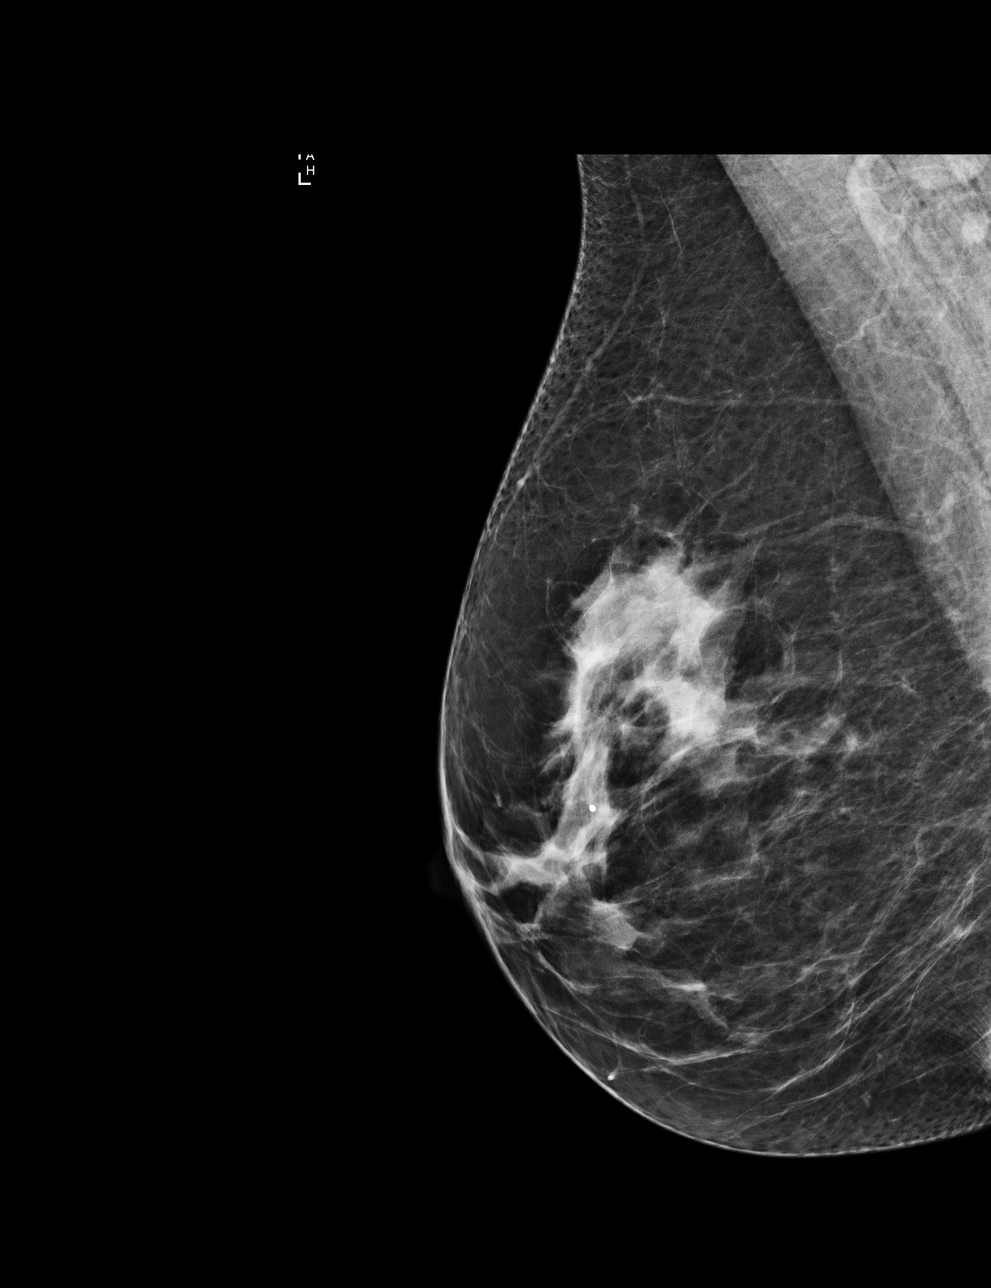

[4 of 4 positions shown; findings below may reference images not displayed]

ACR Breast Density Category c: The breast tissue is heterogeneously
dense, which may obscure small masses.
FINDINGS: There are no findings suspicious for malignancy. Images were
processed with CAD.
IMPRESSION: No mammographic evidence of malignancy. A result letter of this
screening mammogram will be mailed directly to the patient.

RECOMMENDATION:
Screening mammogram in one year. (Code:[id number]-[id number])

BI-RADS CATEGORY  1: Negative.

## 2020-04-26 ENCOUNTER — Encounter: Payer: Self-pay | Admitting: Internal Medicine

## 2020-04-28 ENCOUNTER — Ambulatory Visit (HOSPITAL_COMMUNITY): Payer: Medicare PPO

## 2020-04-29 ENCOUNTER — Ambulatory Visit: Payer: Medicare PPO | Admitting: Internal Medicine

## 2020-04-29 ENCOUNTER — Encounter: Payer: Self-pay | Admitting: Internal Medicine

## 2020-04-29 DIAGNOSIS — M9903 Segmental and somatic dysfunction of lumbar region: Secondary | ICD-10-CM | POA: Diagnosis not present

## 2020-04-29 DIAGNOSIS — M5441 Lumbago with sciatica, right side: Secondary | ICD-10-CM | POA: Diagnosis not present

## 2020-04-29 DIAGNOSIS — M9901 Segmental and somatic dysfunction of cervical region: Secondary | ICD-10-CM | POA: Diagnosis not present

## 2020-04-29 DIAGNOSIS — M9905 Segmental and somatic dysfunction of pelvic region: Secondary | ICD-10-CM | POA: Diagnosis not present

## 2020-04-29 DIAGNOSIS — M9902 Segmental and somatic dysfunction of thoracic region: Secondary | ICD-10-CM | POA: Diagnosis not present

## 2020-04-30 ENCOUNTER — Other Ambulatory Visit: Payer: Self-pay | Admitting: Internal Medicine

## 2020-04-30 ENCOUNTER — Other Ambulatory Visit: Payer: Self-pay

## 2020-05-04 ENCOUNTER — Other Ambulatory Visit: Payer: Self-pay

## 2020-05-04 ENCOUNTER — Ambulatory Visit
Admission: RE | Admit: 2020-05-04 | Discharge: 2020-05-04 | Disposition: A | Discharge status: Home / Self Care | Payer: Medicare PPO | Source: Ambulatory Visit | Attending: Internal Medicine | Admitting: Internal Medicine

## 2020-05-04 DIAGNOSIS — M48061 Spinal stenosis, lumbar region without neurogenic claudication: Secondary | ICD-10-CM | POA: Diagnosis not present

## 2020-05-04 DIAGNOSIS — M5431 Sciatica, right side: Secondary | ICD-10-CM

## 2020-05-06 DIAGNOSIS — M9902 Segmental and somatic dysfunction of thoracic region: Secondary | ICD-10-CM | POA: Diagnosis not present

## 2020-05-06 DIAGNOSIS — M9901 Segmental and somatic dysfunction of cervical region: Secondary | ICD-10-CM | POA: Diagnosis not present

## 2020-05-06 DIAGNOSIS — M5441 Lumbago with sciatica, right side: Secondary | ICD-10-CM | POA: Diagnosis not present

## 2020-05-06 DIAGNOSIS — M9905 Segmental and somatic dysfunction of pelvic region: Secondary | ICD-10-CM | POA: Diagnosis not present

## 2020-05-06 DIAGNOSIS — M9903 Segmental and somatic dysfunction of lumbar region: Secondary | ICD-10-CM | POA: Diagnosis not present

## 2020-05-11 ENCOUNTER — Encounter: Payer: Self-pay | Admitting: Internal Medicine

## 2020-05-11 ENCOUNTER — Other Ambulatory Visit: Payer: Self-pay

## 2020-05-11 ENCOUNTER — Other Ambulatory Visit: Payer: Self-pay | Admitting: Internal Medicine

## 2020-05-11 ENCOUNTER — Ambulatory Visit: Payer: Medicare PPO | Admitting: Internal Medicine

## 2020-05-11 VITALS — BP 126/74 | HR 69 | Ht 65.0 in | Wt 141.6 lb

## 2020-05-11 DIAGNOSIS — R55 Syncope and collapse: Secondary | ICD-10-CM | POA: Diagnosis not present
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
## 2020-05-11 NOTE — Patient Instructions (Addendum)
Medication Instructions:  Your physician recommends that you continue on your current medications as directed. Please refer to the Current Medication list given to you today.  *If you need a refill on your cardiac medications before your next appointment, please call your pharmacy*   Lab Work: None ordered.  If you have labs (blood work) drawn today and your tests are completely normal, you will receive your results only by: Marland Kitchen MyChart Message (if you have MyChart) OR . A paper copy in the mail If you have any lab test that is abnormal or we need to change your treatment, we will call you to review the results.   Testing/Procedures: None ordered.    Follow-Up: At [facility], you and your health needs are our priority.  As part of our continuing mission to provide you with exceptional heart care, we have created designated Provider Care Teams.  These Care Teams include your primary Cardiologist (physician) and Advanced Practice Providers (APPs -  Physician Assistants and Nurse Practitioners) who all work together to provide you with the care you need, when you need it.  We recommend signing up for the patient portal called "MyChart".  Sign up information is provided on this After Visit Summary.  MyChart is used to connect with patients for Virtual Visits (Telemedicine).  Patients are able to view lab/test results, encounter notes, upcoming appointments, etc.  Non-urgent messages can be sent to your provider as well.   To learn more about what you can do with MyChart, go to [email or web address].    Your next appointment:   05/28/2020 at 0830am  The format for your next appointment:   In person  Provider:   [facility]   Other Instruction:  You may shower with bandage on after 24 hours  Remove bandage on Friday 05/15/2020.   Wound Care, Adult Taking care of your wound properly can help to prevent pain, infection, and scarring. It can also help your wound to heal more  quickly. How to care for your wound Wound care     Follow instructions from your health care provider about how to take care of your wound. Make sure you: ? Wash your hands with soap and water before you change the bandage (dressing). If soap and water are not available, use hand sanitizer. ? Change your dressing as told by your health care provider. ? Leave adhesive strips in place. These skin closures may need to stay in place for 2 weeks or longer. If adhesive strip edges start to loosen and curl up, you may trim the loose edges. Do not remove adhesive strips completely unless your health care provider tells you to do that.  Check your wound area every day for signs of infection. Check for: ? Redness, swelling, or pain. ? Fluid or blood. ? Warmth. ? Pus or a bad smell.  Ask your health care provider if you should clean the wound with mild soap and water. Doing this may include: ? Using a clean towel to pat the wound dry after cleaning it. Do not rub or scrub the wound. ? Covering the incision with a clean dressing.  Ask your health care provider when you can leave the wound uncovered.  Keep the dressing dry until your health care provider says it can be removed. Do not take baths, swim, use a hot tub, or do anything that would put the wound underwater until your health care provider approves. Ask your health care provider if you can take showers. You may  only be allowed to take sponge baths. Medicines   If you were prescribed an antibiotic medicine, cream, or ointment, take or use the antibiotic as told by your health care provider. Do not stop taking or using the antibiotic even if your condition improves.  Take over-the-counter and prescription medicines only as told by your health care provider. If you were prescribed pain medicine, take it 30 or more minutes before you do any wound care or as told by your health care provider. General instructions  Return to your normal  activities as told by your health care provider. Ask your health care provider what activities are safe.  Do not scratch or pick at the wound.  Do not use any products that contain nicotine or tobacco, such as cigarettes and e-cigarettes. These may delay wound healing. If you need help quitting, ask your health care provider.  Keep all follow-up visits as told by your health care provider. This is important.  Eat a diet that includes protein, vitamin A, vitamin C, and other nutrient-rich foods to help the wound heal. ? Foods rich in protein include meat, dairy, beans, nuts, and other sources. ? Foods rich in vitamin A include carrots and dark green, leafy vegetables. ? Foods rich in vitamin C include citrus, tomatoes, and other fruits and vegetables. ? Nutrient-rich foods have protein, carbohydrates, fat, vitamins, or minerals. Eat a variety of healthy foods including vegetables, fruits, and whole grains. Contact a health care provider if:  You received a tetanus shot and you have swelling, severe pain, redness, or bleeding at the injection site.  Your pain is not controlled with medicine.  You have redness, swelling, or pain around the wound.  You have fluid or blood coming from the wound.  Your wound feels warm to the touch.  You have pus or a bad smell coming from the wound.  You have a fever or chills.  You are nauseous or you vomit.  You are dizzy. Get help right away if:  You have a red streak going away from your wound.  The edges of the wound open up and separate.  Your wound is bleeding, and the bleeding does not stop with gentle pressure.  You have a rash.  You faint.  You have trouble breathing. Summary  Always wash your hands with soap and water before changing your bandage (dressing).  To help with healing, eat foods that are rich in protein, vitamin A, vitamin C, and other nutrients.  Check your wound every day for signs of infection. Contact your  health care provider if you suspect that your wound is infected. This information is not intended to replace advice given to you by your health care provider. Make sure you discuss any questions you have with your health care provider. Document Revised: 02/25/2019 Document Reviewed: 05/24/2016 Elsevier Patient Education  [place].

## 2020-05-12 ENCOUNTER — Ambulatory Visit (HOSPITAL_COMMUNITY): Payer: Medicare PPO

## 2020-05-12 ENCOUNTER — Telehealth: Payer: Self-pay

## 2020-05-12 ENCOUNTER — Encounter (HOSPITAL_COMMUNITY): Payer: Self-pay

## 2020-05-12 NOTE — Telephone Encounter (Signed)
Left vm to inform pt that provider received her messages about MRI and going to [facility]

## 2020-05-13 NOTE — Addendum Note (Signed)
Addended by: Solon Augusta on: 05/13/2020 09:05 AM   Modules accepted: Orders

## 2020-05-15 ENCOUNTER — Ambulatory Visit
Admission: EM | Admit: 2020-05-15 | Discharge: 2020-05-15 | Disposition: A | Discharge status: Home / Self Care | Payer: Medicare PPO

## 2020-05-15 NOTE — ED Triage Notes (Signed)
Pt needed help changing dressing chest  from procedure on Monday. Steri strips in place, dried blood noted otherwise intact. No  Other concerns

## 2020-05-22 DIAGNOSIS — M4316 Spondylolisthesis, lumbar region: Secondary | ICD-10-CM | POA: Diagnosis not present

## 2020-05-22 DIAGNOSIS — I10 Essential (primary) hypertension: Secondary | ICD-10-CM | POA: Diagnosis not present

## 2020-05-22 DIAGNOSIS — S3210XA Unspecified fracture of sacrum, initial encounter for closed fracture: Secondary | ICD-10-CM | POA: Diagnosis not present

## 2020-05-27 ENCOUNTER — Ambulatory Visit (INDEPENDENT_AMBULATORY_CARE_PROVIDER_SITE_OTHER): Payer: Medicare PPO | Admitting: Internal Medicine

## 2020-05-27 ENCOUNTER — Other Ambulatory Visit: Payer: Self-pay

## 2020-05-27 ENCOUNTER — Encounter (INDEPENDENT_AMBULATORY_CARE_PROVIDER_SITE_OTHER): Payer: Self-pay | Admitting: Internal Medicine

## 2020-05-27 VITALS — BP 130/80 | HR 74 | Temp 96.0°F | Resp 18 | Ht 65.0 in | Wt 141.0 lb

## 2020-05-27 DIAGNOSIS — E039 Hypothyroidism, unspecified: Secondary | ICD-10-CM | POA: Diagnosis not present

## 2020-05-27 DIAGNOSIS — F5101 Primary insomnia: Secondary | ICD-10-CM

## 2020-05-27 DIAGNOSIS — N951 Menopausal and female climacteric states: Secondary | ICD-10-CM

## 2020-05-27 MED ORDER — ARMOUR THYROID 90 MG PO TABS: 90.0000 mg | ORAL_TABLET | Freq: Every day | ORAL | 3 refills | Status: DC

## 2020-05-27 MED ORDER — PROGESTERONE MICRONIZED 100 MG PO CAPS: 100.0000 mg | ORAL_CAPSULE | Freq: Every day | ORAL | 3 refills | Status: DC

## 2020-05-27 MED ORDER — ESTRADIOL 0.5 MG PO TABS: 0.5000 mg | ORAL_TABLET | Freq: Every day | ORAL | 3 refills | Status: DC
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
## 2020-05-28 ENCOUNTER — Ambulatory Visit: Payer: Medicare PPO

## 2020-05-28 ENCOUNTER — Ambulatory Visit (INDEPENDENT_AMBULATORY_CARE_PROVIDER_SITE_OTHER): Payer: Medicare PPO | Admitting: Emergency Medicine

## 2020-05-28 DIAGNOSIS — R55 Syncope and collapse: Secondary | ICD-10-CM

## 2020-05-28 LAB — CUP PACEART INCLINIC DEVICE CHECK
Date Time Interrogation Session: 20210708090101
Implantable Pulse Generator Implant Date: 20210621

## 2020-05-28 NOTE — Progress Notes (Signed)
ILR wound check in clinic. Steri strips removed. Wound well healed. Loop check in clinic with industry. Battery status: good. R-waves 0.24 mV. 0 symptom episodes, 0 tachy episodes, 0 pause episodes, 0 brady episodes. 0 AF episodes (0 % burden). Monthly summary reports and ROV  as needed with  Dr. Graciela Husbands.  Questions answered.

## 2020-05-28 NOTE — Patient Instructions (Signed)
Monthly checks to be completed remotely.  Use symptom activator to document when symptoms occur.

## 2020-06-01 ENCOUNTER — Telehealth: Payer: Self-pay | Admitting: Internal Medicine

## 2020-06-01 ENCOUNTER — Telehealth: Payer: Self-pay

## 2020-06-01 NOTE — Telephone Encounter (Signed)
Patient reports that she stood up from her reclinier on 05/29/20 around 2015-2030 and " felt like I was going to lose it." She reports that she became dizzy and light headed,her vision became blurry, she began  seeing shades of black and gray. She sat down in the floor due to the fact that her syncopal episodes before the loop implant started with the same symptoms. She then had a syncopal episode of unknown length and used her symtom activator after she came to and was able to locate it. She reports no CP, chest pressure or SOB before or after syncopal episode. She reports a HA for 36 hours after this episode. Assisted patient in sending remote transmission and  full transmission received showing ST transitioning to SVT with a pause then SVT that transistioned to ST .

## 2020-06-01 NOTE — Telephone Encounter (Signed)
Pt changed PCPs. Notified pt of cancelled appts.

## 2020-06-01 NOTE — Telephone Encounter (Signed)
.  Pt c/o Syncope: STAT if syncope occurred within 30 minutes and pt complains of lightheadedness High Priority if episode of passing out, completely, today or in last 24 hours   1. Did you pass out today? No, on Friday 7/9 at 815pm   2. When is the last time you passed out?  Friday 7/9 at 815pm   3. Has this occurred multiple times? Yes  4. Did you have any symptoms prior to passing out? Patient said she had just stood up, felt it coming on, and then she sat down in the floor and said she woke up in the floor.    Patient states that there is a clicker thing that she puts over her heart, she isn't sure if she did it correctly.

## 2020-06-02 NOTE — Telephone Encounter (Signed)
Monitor report reviewed by Dr. Graciela Husbands, the HR pt had does not seem to have caused the symptoms she had.  Continued monitoring for now.  Pt v/u to conitnue using symptom marker for any syncopal or presyncopal symptoms.

## 2020-06-03 ENCOUNTER — Other Ambulatory Visit (HOSPITAL_COMMUNITY): Payer: Self-pay | Admitting: Neurosurgery

## 2020-06-03 ENCOUNTER — Other Ambulatory Visit: Payer: Self-pay | Admitting: Neurosurgery

## 2020-06-03 DIAGNOSIS — S3210XA Unspecified fracture of sacrum, initial encounter for closed fracture: Secondary | ICD-10-CM

## 2020-06-09 ENCOUNTER — Encounter (INDEPENDENT_AMBULATORY_CARE_PROVIDER_SITE_OTHER): Payer: Self-pay | Admitting: Internal Medicine

## 2020-06-09 ENCOUNTER — Ambulatory Visit (INDEPENDENT_AMBULATORY_CARE_PROVIDER_SITE_OTHER): Payer: Medicare PPO | Admitting: Internal Medicine

## 2020-06-09 ENCOUNTER — Other Ambulatory Visit: Payer: Self-pay

## 2020-06-09 VITALS — BP 122/80 | HR 73 | Temp 97.0°F | Resp 18 | Ht 65.0 in | Wt 140.0 lb

## 2020-06-09 DIAGNOSIS — E039 Hypothyroidism, unspecified: Secondary | ICD-10-CM | POA: Diagnosis not present

## 2020-06-09 DIAGNOSIS — N951 Menopausal and female climacteric states: Secondary | ICD-10-CM

## 2020-06-09 DIAGNOSIS — F5101 Primary insomnia: Secondary | ICD-10-CM

## 2020-06-09 MED ORDER — ESTRADIOL 0.5 MG PO TABS: 0.5000 mg | ORAL_TABLET | Freq: Every day | ORAL | 3 refills | Status: DC

## 2020-06-09 MED ORDER — ARMOUR THYROID 30 MG PO TABS: 30.0000 mg | ORAL_TABLET | Freq: Every day | ORAL | 1 refills | Status: DC

## 2020-06-09 MED ORDER — PROGESTERONE MICRONIZED 100 MG PO CAPS: 100.0000 mg | ORAL_CAPSULE | Freq: Every day | ORAL | 3 refills | Status: DC
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
## 2020-06-12 ENCOUNTER — Ambulatory Visit (INDEPENDENT_AMBULATORY_CARE_PROVIDER_SITE_OTHER): Payer: Medicare PPO | Admitting: *Deleted

## 2020-06-12 DIAGNOSIS — R55 Syncope and collapse: Secondary | ICD-10-CM | POA: Diagnosis not present

## 2020-06-14 LAB — CUP PACEART REMOTE DEVICE CHECK
Date Time Interrogation Session: 20210724133449
Implantable Pulse Generator Implant Date: 20210621

## 2020-06-15 NOTE — Progress Notes (Signed)
Carelink Summary Report / Loop Recorder 

## 2020-06-29 ENCOUNTER — Ambulatory Visit: Payer: Medicare PPO | Admitting: Internal Medicine

## 2020-07-01 ENCOUNTER — Ambulatory Visit (HOSPITAL_COMMUNITY)
Admission: RE | Admit: 2020-07-01 | Discharge: 2020-07-01 | Disposition: A | Discharge status: Home / Self Care | Payer: Medicare PPO | Source: Ambulatory Visit | Attending: Neurosurgery | Admitting: Neurosurgery

## 2020-07-01 ENCOUNTER — Other Ambulatory Visit: Payer: Self-pay

## 2020-07-01 DIAGNOSIS — Z90710 Acquired absence of both cervix and uterus: Secondary | ICD-10-CM | POA: Diagnosis not present

## 2020-07-01 DIAGNOSIS — S3210XA Unspecified fracture of sacrum, initial encounter for closed fracture: Secondary | ICD-10-CM | POA: Insufficient documentation

## 2020-07-01 DIAGNOSIS — S322XXA Fracture of coccyx, initial encounter for closed fracture: Secondary | ICD-10-CM | POA: Diagnosis not present

## 2020-07-01 DIAGNOSIS — M7138 Other bursal cyst, other site: Secondary | ICD-10-CM | POA: Diagnosis not present

## 2020-07-01 DIAGNOSIS — M533 Sacrococcygeal disorders, not elsewhere classified: Secondary | ICD-10-CM | POA: Diagnosis not present

## 2020-07-01 DIAGNOSIS — D1809 Hemangioma of other sites: Secondary | ICD-10-CM | POA: Diagnosis not present

## 2020-07-06 ENCOUNTER — Ambulatory Visit: Payer: Medicare PPO | Admitting: Internal Medicine

## 2020-07-07 ENCOUNTER — Ambulatory Visit: Payer: Self-pay

## 2020-07-13 ENCOUNTER — Ambulatory Visit (INDEPENDENT_AMBULATORY_CARE_PROVIDER_SITE_OTHER): Payer: Medicare PPO | Admitting: Internal Medicine

## 2020-07-13 ENCOUNTER — Other Ambulatory Visit: Payer: Self-pay

## 2020-07-13 ENCOUNTER — Encounter (INDEPENDENT_AMBULATORY_CARE_PROVIDER_SITE_OTHER): Payer: Self-pay | Admitting: Internal Medicine

## 2020-07-13 VITALS — BP 120/80 | HR 88 | Temp 97.5°F | Resp 18 | Ht 65.0 in | Wt 141.0 lb

## 2020-07-13 DIAGNOSIS — N951 Menopausal and female climacteric states: Secondary | ICD-10-CM | POA: Diagnosis not present

## 2020-07-13 DIAGNOSIS — E039 Hypothyroidism, unspecified: Secondary | ICD-10-CM | POA: Diagnosis not present
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
## 2020-07-14 ENCOUNTER — Other Ambulatory Visit (INDEPENDENT_AMBULATORY_CARE_PROVIDER_SITE_OTHER): Payer: Self-pay | Admitting: Internal Medicine

## 2020-07-14 LAB — PROGESTERONE: Progesterone: 4.3 ng/mL

## 2020-07-14 LAB — TSH: TSH: 0.58 mIU/L (ref 0.40–4.50)

## 2020-07-14 LAB — T3, FREE: T3, Free: 4.1 pg/mL (ref 2.3–4.2)

## 2020-07-14 LAB — ESTRADIOL: Estradiol: <15 pg/mL

## 2020-07-14 LAB — T4: T4, Total: 5.9 ug/dL (ref 5.1–11.9)

## 2020-07-14 MED ORDER — PROGESTERONE 200 MG PO CAPS
200.0000 mg | ORAL_CAPSULE | Freq: Every day | ORAL | 3 refills | Status: DC
Start: 2020-07-14 — End: 2020-08-11

## 2020-07-15 ENCOUNTER — Ambulatory Visit (INDEPENDENT_AMBULATORY_CARE_PROVIDER_SITE_OTHER): Payer: Medicare PPO | Admitting: *Deleted

## 2020-07-15 DIAGNOSIS — R55 Syncope and collapse: Secondary | ICD-10-CM

## 2020-07-16 LAB — CUP PACEART REMOTE DEVICE CHECK
Date Time Interrogation Session: 20210826134055
Implantable Pulse Generator Implant Date: 20210621

## 2020-07-17 DIAGNOSIS — Z1159 Encounter for screening for other viral diseases: Secondary | ICD-10-CM | POA: Diagnosis not present

## 2020-07-20 NOTE — Progress Notes (Signed)
Carelink Summary Report / Loop Recorder 

## 2020-07-21 ENCOUNTER — Ambulatory Visit: Payer: Self-pay

## 2020-07-24 DIAGNOSIS — Z1159 Encounter for screening for other viral diseases: Secondary | ICD-10-CM | POA: Diagnosis not present

## 2020-07-31 DIAGNOSIS — S3210XA Unspecified fracture of sacrum, initial encounter for closed fracture: Secondary | ICD-10-CM | POA: Diagnosis not present

## 2020-07-31 DIAGNOSIS — I10 Essential (primary) hypertension: Secondary | ICD-10-CM | POA: Diagnosis not present

## 2020-07-31 DIAGNOSIS — M7061 Trochanteric bursitis, right hip: Secondary | ICD-10-CM | POA: Diagnosis not present

## 2020-07-31 DIAGNOSIS — M4316 Spondylolisthesis, lumbar region: Secondary | ICD-10-CM | POA: Diagnosis not present

## 2020-08-03 ENCOUNTER — Other Ambulatory Visit: Payer: Medicare PPO

## 2020-08-03 DIAGNOSIS — Z20822 Contact with and (suspected) exposure to covid-19: Secondary | ICD-10-CM

## 2020-08-05 LAB — SARS-COV-2, NAA 2 DAY TAT

## 2020-08-05 LAB — NOVEL CORONAVIRUS, NAA: SARS-CoV-2, NAA: NOT DETECTED

## 2020-08-06 DIAGNOSIS — M25551 Pain in right hip: Secondary | ICD-10-CM | POA: Diagnosis not present

## 2020-08-07 ENCOUNTER — Other Ambulatory Visit: Payer: Self-pay

## 2020-08-07 ENCOUNTER — Other Ambulatory Visit: Payer: Medicare PPO

## 2020-08-07 DIAGNOSIS — Z20822 Contact with and (suspected) exposure to covid-19: Secondary | ICD-10-CM | POA: Diagnosis not present

## 2020-08-10 LAB — NOVEL CORONAVIRUS, NAA: SARS-CoV-2, NAA: NOT DETECTED

## 2020-08-11 ENCOUNTER — Other Ambulatory Visit: Payer: Self-pay

## 2020-08-11 ENCOUNTER — Encounter (INDEPENDENT_AMBULATORY_CARE_PROVIDER_SITE_OTHER): Payer: Self-pay | Admitting: Internal Medicine

## 2020-08-11 ENCOUNTER — Ambulatory Visit (INDEPENDENT_AMBULATORY_CARE_PROVIDER_SITE_OTHER): Payer: Medicare PPO | Admitting: Internal Medicine

## 2020-08-11 VITALS — BP 128/70 | HR 80 | Temp 96.2°F | Resp 18 | Ht 65.0 in | Wt 139.0 lb

## 2020-08-11 DIAGNOSIS — F5101 Primary insomnia: Secondary | ICD-10-CM

## 2020-08-11 DIAGNOSIS — E039 Hypothyroidism, unspecified: Secondary | ICD-10-CM

## 2020-08-11 DIAGNOSIS — N951 Menopausal and female climacteric states: Secondary | ICD-10-CM | POA: Diagnosis not present

## 2020-08-11 MED ORDER — ESTRADIOL 0.5 MG PO TABS
0.5000 mg | ORAL_TABLET | Freq: Every day | ORAL | 3 refills | Status: DC
Start: 2020-08-11 — End: 2020-09-23
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
## 2020-08-13 ENCOUNTER — Other Ambulatory Visit: Payer: Medicare PPO

## 2020-08-13 DIAGNOSIS — Z20822 Contact with and (suspected) exposure to covid-19: Secondary | ICD-10-CM | POA: Diagnosis not present

## 2020-08-15 LAB — SARS-COV-2, NAA 2 DAY TAT

## 2020-08-15 LAB — NOVEL CORONAVIRUS, NAA: SARS-CoV-2, NAA: NOT DETECTED

## 2020-08-17 ENCOUNTER — Ambulatory Visit (INDEPENDENT_AMBULATORY_CARE_PROVIDER_SITE_OTHER): Payer: Medicare PPO | Admitting: Emergency Medicine

## 2020-08-17 DIAGNOSIS — R55 Syncope and collapse: Secondary | ICD-10-CM

## 2020-08-18 ENCOUNTER — Ambulatory Visit (HOSPITAL_COMMUNITY): Payer: Medicare PPO | Attending: Neurosurgery | Admitting: Physical Therapy

## 2020-08-18 ENCOUNTER — Other Ambulatory Visit: Payer: Self-pay

## 2020-08-18 ENCOUNTER — Encounter (HOSPITAL_COMMUNITY): Payer: Self-pay | Admitting: Physical Therapy

## 2020-08-18 DIAGNOSIS — M25551 Pain in right hip: Secondary | ICD-10-CM | POA: Insufficient documentation

## 2020-08-18 DIAGNOSIS — R29898 Other symptoms and signs involving the musculoskeletal system: Secondary | ICD-10-CM | POA: Insufficient documentation

## 2020-08-18 DIAGNOSIS — R2689 Other abnormalities of gait and mobility: Secondary | ICD-10-CM | POA: Diagnosis not present

## 2020-08-18 DIAGNOSIS — M6281 Muscle weakness (generalized): Secondary | ICD-10-CM | POA: Diagnosis not present

## 2020-08-18 NOTE — Patient Instructions (Signed)
Access Code: [email or web address] URL: [email or web address] Date: 08/18/2020 Prepared by: Greig Castilla Evelynne Spiers  Exercises Supine Bridge - 1 x daily - 7 x weekly - 2 sets - 10 reps
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
## 2020-08-19 LAB — CUP PACEART REMOTE DEVICE CHECK
Date Time Interrogation Session: 20210928134326
Implantable Pulse Generator Implant Date: 20210621

## 2020-08-19 NOTE — Progress Notes (Signed)
Carelink Summary Report / Loop Recorder 

## 2020-08-20 ENCOUNTER — Other Ambulatory Visit: Payer: Medicare PPO

## 2020-08-20 DIAGNOSIS — Z20822 Contact with and (suspected) exposure to covid-19: Secondary | ICD-10-CM

## 2020-08-21 DIAGNOSIS — M25551 Pain in right hip: Secondary | ICD-10-CM | POA: Diagnosis not present

## 2020-08-21 LAB — SARS-COV-2, NAA 2 DAY TAT

## 2020-08-21 LAB — NOVEL CORONAVIRUS, NAA: SARS-CoV-2, NAA: NOT DETECTED

## 2020-08-24 DIAGNOSIS — M9902 Segmental and somatic dysfunction of thoracic region: Secondary | ICD-10-CM | POA: Diagnosis not present

## 2020-08-24 DIAGNOSIS — M542 Cervicalgia: Secondary | ICD-10-CM | POA: Diagnosis not present

## 2020-08-24 DIAGNOSIS — M9901 Segmental and somatic dysfunction of cervical region: Secondary | ICD-10-CM | POA: Diagnosis not present

## 2020-08-24 DIAGNOSIS — M9903 Segmental and somatic dysfunction of lumbar region: Secondary | ICD-10-CM | POA: Diagnosis not present

## 2020-08-24 DIAGNOSIS — M9905 Segmental and somatic dysfunction of pelvic region: Secondary | ICD-10-CM | POA: Diagnosis not present

## 2020-08-24 DIAGNOSIS — M5441 Lumbago with sciatica, right side: Secondary | ICD-10-CM | POA: Diagnosis not present

## 2020-08-25 ENCOUNTER — Encounter (HOSPITAL_COMMUNITY): Payer: Self-pay

## 2020-08-25 ENCOUNTER — Ambulatory Visit (HOSPITAL_COMMUNITY): Payer: Medicare PPO | Attending: Neurosurgery

## 2020-08-25 ENCOUNTER — Other Ambulatory Visit: Payer: Self-pay | Admitting: Physical Medicine & Rehabilitation

## 2020-08-25 ENCOUNTER — Other Ambulatory Visit: Payer: Self-pay

## 2020-08-25 ENCOUNTER — Other Ambulatory Visit (HOSPITAL_COMMUNITY): Payer: Self-pay | Admitting: Physical Medicine & Rehabilitation

## 2020-08-25 DIAGNOSIS — M25551 Pain in right hip: Secondary | ICD-10-CM

## 2020-08-25 DIAGNOSIS — M6281 Muscle weakness (generalized): Secondary | ICD-10-CM

## 2020-08-25 DIAGNOSIS — R2689 Other abnormalities of gait and mobility: Secondary | ICD-10-CM

## 2020-08-25 DIAGNOSIS — R29898 Other symptoms and signs involving the musculoskeletal system: Secondary | ICD-10-CM
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
## 2020-08-27 ENCOUNTER — Other Ambulatory Visit: Payer: Medicare PPO

## 2020-08-27 DIAGNOSIS — Z20822 Contact with and (suspected) exposure to covid-19: Secondary | ICD-10-CM

## 2020-08-28 ENCOUNTER — Ambulatory Visit (HOSPITAL_COMMUNITY): Payer: Medicare PPO | Admitting: Physical Therapy

## 2020-08-28 ENCOUNTER — Encounter (HOSPITAL_COMMUNITY): Payer: Self-pay | Admitting: Physical Therapy

## 2020-08-28 ENCOUNTER — Other Ambulatory Visit: Payer: Self-pay

## 2020-08-28 DIAGNOSIS — M6281 Muscle weakness (generalized): Secondary | ICD-10-CM | POA: Diagnosis not present

## 2020-08-28 DIAGNOSIS — M25551 Pain in right hip: Secondary | ICD-10-CM | POA: Diagnosis not present

## 2020-08-28 DIAGNOSIS — R29898 Other symptoms and signs involving the musculoskeletal system: Secondary | ICD-10-CM

## 2020-08-28 DIAGNOSIS — R2689 Other abnormalities of gait and mobility: Secondary | ICD-10-CM
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
## 2020-08-29 LAB — NOVEL CORONAVIRUS, NAA: SARS-CoV-2, NAA: NOT DETECTED

## 2020-08-29 LAB — SARS-COV-2, NAA 2 DAY TAT

## 2020-09-01 ENCOUNTER — Other Ambulatory Visit: Payer: Self-pay

## 2020-09-01 ENCOUNTER — Ambulatory Visit (HOSPITAL_COMMUNITY)
Admission: RE | Admit: 2020-09-01 | Discharge: 2020-09-01 | Disposition: A | Discharge status: Home / Self Care | Payer: Medicare PPO | Source: Ambulatory Visit | Attending: Physical Medicine & Rehabilitation | Admitting: Physical Medicine & Rehabilitation

## 2020-09-01 DIAGNOSIS — M25551 Pain in right hip: Secondary | ICD-10-CM | POA: Insufficient documentation

## 2020-09-01 DIAGNOSIS — M533 Sacrococcygeal disorders, not elsewhere classified: Secondary | ICD-10-CM | POA: Diagnosis not present

## 2020-09-01 DIAGNOSIS — M1611 Unilateral primary osteoarthritis, right hip: Secondary | ICD-10-CM | POA: Diagnosis not present

## 2020-09-03 ENCOUNTER — Other Ambulatory Visit: Payer: Self-pay

## 2020-09-03 ENCOUNTER — Other Ambulatory Visit: Payer: Medicare PPO

## 2020-09-03 ENCOUNTER — Encounter (HOSPITAL_COMMUNITY): Payer: Self-pay

## 2020-09-03 ENCOUNTER — Ambulatory Visit (HOSPITAL_COMMUNITY): Payer: Medicare PPO

## 2020-09-03 DIAGNOSIS — Z20822 Contact with and (suspected) exposure to covid-19: Secondary | ICD-10-CM

## 2020-09-03 DIAGNOSIS — R2689 Other abnormalities of gait and mobility: Secondary | ICD-10-CM | POA: Diagnosis not present

## 2020-09-03 DIAGNOSIS — M6281 Muscle weakness (generalized): Secondary | ICD-10-CM | POA: Diagnosis not present

## 2020-09-03 DIAGNOSIS — M25551 Pain in right hip: Secondary | ICD-10-CM

## 2020-09-03 DIAGNOSIS — R29898 Other symptoms and signs involving the musculoskeletal system: Secondary | ICD-10-CM | POA: Diagnosis not present
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
## 2020-09-04 ENCOUNTER — Encounter (HOSPITAL_COMMUNITY): Payer: Medicare PPO

## 2020-09-04 LAB — SARS-COV-2, NAA 2 DAY TAT

## 2020-09-04 LAB — NOVEL CORONAVIRUS, NAA: SARS-CoV-2, NAA: NOT DETECTED

## 2020-09-07 DIAGNOSIS — M9905 Segmental and somatic dysfunction of pelvic region: Secondary | ICD-10-CM | POA: Diagnosis not present

## 2020-09-07 DIAGNOSIS — M542 Cervicalgia: Secondary | ICD-10-CM | POA: Diagnosis not present

## 2020-09-07 DIAGNOSIS — M9901 Segmental and somatic dysfunction of cervical region: Secondary | ICD-10-CM | POA: Diagnosis not present

## 2020-09-07 DIAGNOSIS — M9903 Segmental and somatic dysfunction of lumbar region: Secondary | ICD-10-CM | POA: Diagnosis not present

## 2020-09-07 DIAGNOSIS — M5441 Lumbago with sciatica, right side: Secondary | ICD-10-CM | POA: Diagnosis not present

## 2020-09-07 DIAGNOSIS — M25551 Pain in right hip: Secondary | ICD-10-CM | POA: Diagnosis not present

## 2020-09-07 DIAGNOSIS — M9902 Segmental and somatic dysfunction of thoracic region: Secondary | ICD-10-CM | POA: Diagnosis not present

## 2020-09-08 ENCOUNTER — Other Ambulatory Visit: Payer: Self-pay

## 2020-09-08 ENCOUNTER — Ambulatory Visit (HOSPITAL_COMMUNITY): Payer: Medicare PPO

## 2020-09-08 ENCOUNTER — Encounter (HOSPITAL_COMMUNITY): Payer: Self-pay

## 2020-09-08 DIAGNOSIS — R2689 Other abnormalities of gait and mobility: Secondary | ICD-10-CM

## 2020-09-08 DIAGNOSIS — M25551 Pain in right hip: Secondary | ICD-10-CM

## 2020-09-08 DIAGNOSIS — R29898 Other symptoms and signs involving the musculoskeletal system: Secondary | ICD-10-CM | POA: Diagnosis not present

## 2020-09-08 DIAGNOSIS — M6281 Muscle weakness (generalized): Secondary | ICD-10-CM
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
## 2020-09-10 ENCOUNTER — Other Ambulatory Visit: Payer: Medicare PPO

## 2020-09-10 DIAGNOSIS — Z20822 Contact with and (suspected) exposure to covid-19: Secondary | ICD-10-CM | POA: Diagnosis not present

## 2020-09-11 ENCOUNTER — Ambulatory Visit (HOSPITAL_COMMUNITY): Payer: Medicare PPO

## 2020-09-11 LAB — SARS-COV-2, NAA 2 DAY TAT

## 2020-09-11 LAB — NOVEL CORONAVIRUS, NAA: SARS-CoV-2, NAA: NOT DETECTED

## 2020-09-15 ENCOUNTER — Ambulatory Visit (HOSPITAL_COMMUNITY): Payer: Medicare PPO | Admitting: Physical Therapy

## 2020-09-15 ENCOUNTER — Other Ambulatory Visit: Payer: Self-pay

## 2020-09-15 ENCOUNTER — Encounter (HOSPITAL_COMMUNITY): Payer: Self-pay | Admitting: Physical Therapy

## 2020-09-15 DIAGNOSIS — R2689 Other abnormalities of gait and mobility: Secondary | ICD-10-CM

## 2020-09-15 DIAGNOSIS — M6281 Muscle weakness (generalized): Secondary | ICD-10-CM | POA: Diagnosis not present

## 2020-09-15 DIAGNOSIS — M25551 Pain in right hip: Secondary | ICD-10-CM | POA: Diagnosis not present

## 2020-09-15 DIAGNOSIS — R29898 Other symptoms and signs involving the musculoskeletal system: Secondary | ICD-10-CM | POA: Diagnosis not present
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
## 2020-09-17 ENCOUNTER — Other Ambulatory Visit: Payer: Medicare PPO

## 2020-09-17 DIAGNOSIS — Z20822 Contact with and (suspected) exposure to covid-19: Secondary | ICD-10-CM | POA: Diagnosis not present

## 2020-09-18 ENCOUNTER — Encounter (HOSPITAL_COMMUNITY): Payer: Self-pay | Admitting: Physical Therapy

## 2020-09-18 ENCOUNTER — Other Ambulatory Visit: Payer: Self-pay

## 2020-09-18 ENCOUNTER — Ambulatory Visit (HOSPITAL_COMMUNITY): Payer: Medicare PPO | Admitting: Physical Therapy

## 2020-09-18 DIAGNOSIS — M6281 Muscle weakness (generalized): Secondary | ICD-10-CM

## 2020-09-18 DIAGNOSIS — R29898 Other symptoms and signs involving the musculoskeletal system: Secondary | ICD-10-CM | POA: Diagnosis not present

## 2020-09-18 DIAGNOSIS — R2689 Other abnormalities of gait and mobility: Secondary | ICD-10-CM

## 2020-09-18 DIAGNOSIS — M25551 Pain in right hip: Secondary | ICD-10-CM | POA: Diagnosis not present

## 2020-09-18 LAB — NOVEL CORONAVIRUS, NAA: SARS-CoV-2, NAA: NOT DETECTED

## 2020-09-18 LAB — SARS-COV-2, NAA 2 DAY TAT

## 2020-09-18 NOTE — Patient Instructions (Signed)
Access Code: [email or web address] URL: [email or web address] Date: 09/18/2020 Prepared by: Greig Castilla Cherene Dobbins  Exercises Prone Hip Extension - 1 x daily - 7 x weekly - 1 sets - 10 reps Step Up - 1 x daily - 7 x weekly - 2 sets - 10 reps
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
## 2020-09-21 ENCOUNTER — Ambulatory Visit (INDEPENDENT_AMBULATORY_CARE_PROVIDER_SITE_OTHER): Payer: Medicare PPO

## 2020-09-21 DIAGNOSIS — R55 Syncope and collapse: Secondary | ICD-10-CM

## 2020-09-21 LAB — CUP PACEART REMOTE DEVICE CHECK
Date Time Interrogation Session: 20211031134447
Implantable Pulse Generator Implant Date: 20210621

## 2020-09-22 ENCOUNTER — Ambulatory Visit (HOSPITAL_COMMUNITY): Payer: Medicare PPO | Attending: Neurosurgery | Admitting: Physical Therapy

## 2020-09-22 ENCOUNTER — Encounter (HOSPITAL_COMMUNITY): Payer: Self-pay | Admitting: Physical Therapy

## 2020-09-22 ENCOUNTER — Ambulatory Visit (INDEPENDENT_AMBULATORY_CARE_PROVIDER_SITE_OTHER): Payer: Medicare PPO | Admitting: Internal Medicine

## 2020-09-22 ENCOUNTER — Encounter (INDEPENDENT_AMBULATORY_CARE_PROVIDER_SITE_OTHER): Payer: Self-pay | Admitting: Internal Medicine

## 2020-09-22 ENCOUNTER — Other Ambulatory Visit: Payer: Self-pay

## 2020-09-22 VITALS — BP 140/92 | HR 77 | Temp 96.8°F | Resp 18 | Ht 65.0 in | Wt 132.0 lb

## 2020-09-22 DIAGNOSIS — M6281 Muscle weakness (generalized): Secondary | ICD-10-CM | POA: Diagnosis not present

## 2020-09-22 DIAGNOSIS — N951 Menopausal and female climacteric states: Secondary | ICD-10-CM

## 2020-09-22 DIAGNOSIS — E559 Vitamin D deficiency, unspecified: Secondary | ICD-10-CM | POA: Diagnosis not present

## 2020-09-22 DIAGNOSIS — E538 Deficiency of other specified B group vitamins: Secondary | ICD-10-CM | POA: Diagnosis not present

## 2020-09-22 DIAGNOSIS — E782 Mixed hyperlipidemia: Secondary | ICD-10-CM

## 2020-09-22 DIAGNOSIS — E039 Hypothyroidism, unspecified: Secondary | ICD-10-CM

## 2020-09-22 DIAGNOSIS — R29898 Other symptoms and signs involving the musculoskeletal system: Secondary | ICD-10-CM | POA: Insufficient documentation

## 2020-09-22 DIAGNOSIS — F520 Hypoactive sexual desire disorder: Secondary | ICD-10-CM | POA: Diagnosis not present

## 2020-09-22 DIAGNOSIS — R2689 Other abnormalities of gait and mobility: Secondary | ICD-10-CM | POA: Insufficient documentation

## 2020-09-22 DIAGNOSIS — M545 Low back pain, unspecified: Secondary | ICD-10-CM | POA: Diagnosis not present

## 2020-09-22 DIAGNOSIS — M25551 Pain in right hip: Secondary | ICD-10-CM | POA: Insufficient documentation
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
## 2020-09-23 ENCOUNTER — Other Ambulatory Visit (INDEPENDENT_AMBULATORY_CARE_PROVIDER_SITE_OTHER): Payer: Self-pay | Admitting: Internal Medicine

## 2020-09-23 MED ORDER — ESTRADIOL 1 MG PO TABS: 1.0000 mg | ORAL_TABLET | Freq: Every day | ORAL | 3 refills | Status: DC

## 2020-09-23 MED ORDER — ARMOUR THYROID 120 MG PO TABS: 120.0000 mg | ORAL_TABLET | Freq: Every day | ORAL | 3 refills | Status: DC

## 2020-09-23 NOTE — Progress Notes (Signed)
Carelink Summary Report / Loop Recorder 

## 2020-09-24 ENCOUNTER — Other Ambulatory Visit: Payer: Medicare PPO

## 2020-09-24 DIAGNOSIS — Z20822 Contact with and (suspected) exposure to covid-19: Secondary | ICD-10-CM

## 2020-09-25 ENCOUNTER — Encounter (HOSPITAL_COMMUNITY): Payer: Self-pay | Admitting: Physical Therapy

## 2020-09-25 ENCOUNTER — Other Ambulatory Visit: Payer: Self-pay

## 2020-09-25 ENCOUNTER — Ambulatory Visit (HOSPITAL_COMMUNITY): Payer: Medicare PPO | Admitting: Physical Therapy

## 2020-09-25 DIAGNOSIS — R2689 Other abnormalities of gait and mobility: Secondary | ICD-10-CM

## 2020-09-25 DIAGNOSIS — M6281 Muscle weakness (generalized): Secondary | ICD-10-CM | POA: Diagnosis not present

## 2020-09-25 DIAGNOSIS — R29898 Other symptoms and signs involving the musculoskeletal system: Secondary | ICD-10-CM | POA: Diagnosis not present

## 2020-09-25 DIAGNOSIS — M25551 Pain in right hip: Secondary | ICD-10-CM | POA: Diagnosis not present

## 2020-09-25 LAB — TESTOS,TOTAL,FREE AND SHBG (FEMALE)
Free Testosterone: 0.6 pg/mL (ref 0.1–6.4)
Sex Hormone Binding: 123 nmol/L — ABNORMAL HIGH (ref 14–73)
Testosterone, Total, LC-MS-MS: 9 ng/dL (ref 2–45)

## 2020-09-25 LAB — T3, FREE: T3, Free: 3.6 pg/mL (ref 2.3–4.2)

## 2020-09-25 LAB — LIPID PANEL
Cholesterol: 277 mg/dL — ABNORMAL HIGH (ref <200)
HDL: 71 mg/dL (ref >=50)
LDL Cholesterol (Calc): 179 mg/dL (calc) — ABNORMAL HIGH
Non-HDL Cholesterol (Calc): 206 mg/dL (calc) — ABNORMAL HIGH (ref <130)
Total CHOL/HDL Ratio: 3.9 (calc) (ref <5.0)
Triglycerides: 130 mg/dL (ref <150)

## 2020-09-25 LAB — T4: T4, Total: 6.1 ug/dL (ref 5.1–11.9)

## 2020-09-25 LAB — B12 AND FOLATE PANEL
Folate: 19.2 ng/mL
Vitamin B-12: 452 pg/mL (ref 200–1100)

## 2020-09-25 LAB — SARS-COV-2, NAA 2 DAY TAT

## 2020-09-25 LAB — NOVEL CORONAVIRUS, NAA: SARS-CoV-2, NAA: NOT DETECTED

## 2020-09-25 LAB — VITAMIN D 25 HYDROXY (VIT D DEFICIENCY, FRACTURES): Vit D, 25-Hydroxy: 35 ng/mL (ref 30–100)

## 2020-09-25 LAB — TSH: TSH: 0.43 mIU/L (ref 0.40–4.50)

## 2020-09-25 LAB — ESTRADIOL: Estradiol: 25 pg/mL

## 2020-09-25 LAB — PROGESTERONE: Progesterone: 4 ng/mL

## 2020-09-25 LAB — C-REACTIVE PROTEIN: CRP: 1.8 mg/L (ref <8.0)
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
## 2020-09-29 ENCOUNTER — Ambulatory Visit (HOSPITAL_COMMUNITY): Payer: Medicare PPO | Admitting: Physical Therapy

## 2020-10-01 ENCOUNTER — Other Ambulatory Visit: Payer: Medicare PPO

## 2020-10-01 DIAGNOSIS — Z20822 Contact with and (suspected) exposure to covid-19: Secondary | ICD-10-CM

## 2020-10-02 ENCOUNTER — Ambulatory Visit (HOSPITAL_COMMUNITY): Payer: Medicare PPO | Admitting: Physical Therapy

## 2020-10-02 LAB — SARS-COV-2, NAA 2 DAY TAT

## 2020-10-02 LAB — NOVEL CORONAVIRUS, NAA: SARS-CoV-2, NAA: NOT DETECTED

## 2020-10-08 ENCOUNTER — Other Ambulatory Visit: Payer: Medicare PPO

## 2020-10-08 DIAGNOSIS — Z20822 Contact with and (suspected) exposure to covid-19: Secondary | ICD-10-CM

## 2020-10-09 LAB — NOVEL CORONAVIRUS, NAA: SARS-CoV-2, NAA: NOT DETECTED

## 2020-10-09 LAB — SARS-COV-2, NAA 2 DAY TAT

## 2020-10-13 DIAGNOSIS — Z20822 Contact with and (suspected) exposure to covid-19: Secondary | ICD-10-CM | POA: Diagnosis not present

## 2020-10-19 ENCOUNTER — Other Ambulatory Visit: Payer: Medicare PPO

## 2020-10-19 DIAGNOSIS — M9905 Segmental and somatic dysfunction of pelvic region: Secondary | ICD-10-CM | POA: Diagnosis not present

## 2020-10-19 DIAGNOSIS — M542 Cervicalgia: Secondary | ICD-10-CM | POA: Diagnosis not present

## 2020-10-19 DIAGNOSIS — M5441 Lumbago with sciatica, right side: Secondary | ICD-10-CM | POA: Diagnosis not present

## 2020-10-19 DIAGNOSIS — M9901 Segmental and somatic dysfunction of cervical region: Secondary | ICD-10-CM | POA: Diagnosis not present

## 2020-10-19 DIAGNOSIS — M9903 Segmental and somatic dysfunction of lumbar region: Secondary | ICD-10-CM | POA: Diagnosis not present

## 2020-10-19 DIAGNOSIS — M9902 Segmental and somatic dysfunction of thoracic region: Secondary | ICD-10-CM | POA: Diagnosis not present

## 2020-10-21 DIAGNOSIS — M5441 Lumbago with sciatica, right side: Secondary | ICD-10-CM | POA: Diagnosis not present

## 2020-10-21 DIAGNOSIS — M9905 Segmental and somatic dysfunction of pelvic region: Secondary | ICD-10-CM | POA: Diagnosis not present

## 2020-10-21 DIAGNOSIS — M542 Cervicalgia: Secondary | ICD-10-CM | POA: Diagnosis not present

## 2020-10-21 DIAGNOSIS — M9903 Segmental and somatic dysfunction of lumbar region: Secondary | ICD-10-CM | POA: Diagnosis not present

## 2020-10-21 DIAGNOSIS — M9901 Segmental and somatic dysfunction of cervical region: Secondary | ICD-10-CM | POA: Diagnosis not present

## 2020-10-21 DIAGNOSIS — M9902 Segmental and somatic dysfunction of thoracic region: Secondary | ICD-10-CM | POA: Diagnosis not present

## 2020-10-22 ENCOUNTER — Other Ambulatory Visit: Payer: Medicare PPO

## 2020-10-22 DIAGNOSIS — Z20822 Contact with and (suspected) exposure to covid-19: Secondary | ICD-10-CM | POA: Diagnosis not present

## 2020-10-23 LAB — NOVEL CORONAVIRUS, NAA: SARS-CoV-2, NAA: DETECTED — AB

## 2020-10-23 LAB — SARS-COV-2, NAA 2 DAY TAT

## 2020-10-24 ENCOUNTER — Telehealth: Payer: Self-pay | Admitting: Nurse Practitioner

## 2020-10-24 ENCOUNTER — Other Ambulatory Visit: Payer: Self-pay | Admitting: Nurse Practitioner

## 2020-10-24 ENCOUNTER — Telehealth (HOSPITAL_COMMUNITY): Payer: Self-pay

## 2020-10-24 NOTE — Telephone Encounter (Signed)
COVID MAB Infusion Contact Note   Qualifiers: Age over 53, HTN, and thyroid disease Symptoms: headache, sore throat, fever, body aches and body drainage.  Symptom Onset: 12/2   Contact via telephone to discuss symptoms and evaluate for interest and qualifications for MAB Infusion treatment. Patient appears to qualify for at-risk group according to the FDA Emergency Use Authorization.   Unable to reach patient by telephone. Voicemail left with reason for call and direct call back information.   MyChart message sent with contact information to discuss infusion.    Shawna Clamp, DNP, AGNP-c COVID-19 [facility]fusion [facility] [phone]

## 2020-10-24 NOTE — Progress Notes (Signed)
Patient returned call to discuss mAb infusion as treatment option for mild to moderate COVID symptoms.   At this time the patient wishes to discuss the treatment option with her family. Patient provided with information to contact me directly to let us know if she would like to proceed with infusion.   Discussed criteria for eligibility and time window that would be required to receive treatment. All questions answered.   Patient will follow-up.   Shawna Clamp, DNP, AGNP-c COVID-19 M[facility] Infusion [facility] [phone]

## 2020-10-24 NOTE — Telephone Encounter (Signed)
Called to discuss with patient about Covid symptoms and the use of the monoclonal antibody infusion for those with mild to moderate Covid symptoms and at a high risk of hospitalization.     Pt appears to qualify for this infusion due to co-morbid conditions and/or a member of an at-risk group in accordance with the FDA Emergency Use Authorization.    Risk factors include: Age over 45, HTN, and thyroid disease   Symptom onset: 12/2. Sx include headache, sore throat, fever, body aches and body drainage.    Tested positive for COVID 19: [place] mobile unit. 12/2    Patient aware they will receive a call from APP for further information and to schedule appointment. All questions answered.   Gave patient information the address to clinic, [address] and phone number to call once patient arrives, [phone].

## 2020-10-26 ENCOUNTER — Ambulatory Visit (INDEPENDENT_AMBULATORY_CARE_PROVIDER_SITE_OTHER): Payer: Medicare PPO

## 2020-10-26 ENCOUNTER — Other Ambulatory Visit: Payer: Self-pay | Admitting: Physician Assistant

## 2020-10-26 DIAGNOSIS — R55 Syncope and collapse: Secondary | ICD-10-CM | POA: Diagnosis not present

## 2020-10-26 DIAGNOSIS — U071 COVID-19: Secondary | ICD-10-CM

## 2020-10-26 DIAGNOSIS — R54 Age-related physical debility: Secondary | ICD-10-CM

## 2020-10-26 LAB — CUP PACEART REMOTE DEVICE CHECK
Date Time Interrogation Session: 20211203124608
Implantable Pulse Generator Implant Date: 20210621

## 2020-10-26 NOTE — Progress Notes (Signed)
I connected by phone with [name] on 10/26/2020 at 9:59 AM to discuss the potential use of a new treatment for mild to moderate COVID-19 viral infection in non-hospitalized patients.  This patient is a 64 y.o. female that meets the FDA criteria for Emergency Use Authorization of COVID monoclonal antibody sotrovimab, casirivimab/imdevimab or bamlamivimab/estevimab.  Has a (+) direct SARS-CoV-2 viral test result  Has mild or moderate COVID-19   Is NOT hospitalized due to COVID-19  Is within 10 days of symptom onset  Has at least one of the high risk factor(s) for progression to severe COVID-19 and/or hospitalization as defined in EUA.  Specific high risk criteria : Older age (>/= 66 yo)   I have spoken and communicated the following to the patient or parent/caregiver regarding COVID monoclonal antibody treatment:  1. FDA has authorized the emergency use for the treatment of mild to moderate COVID-19 in adults and pediatric patients with positive results of direct SARS-CoV-2 viral testing who are 16 years of age and older weighing at least 40 kg, and who are at high risk for progressing to severe COVID-19 and/or hospitalization.  2. The significant known and potential risks and benefits of COVID monoclonal antibody, and the extent to which such potential risks and benefits are unknown.  3. Information on available alternative treatments and the risks and benefits of those alternatives, including clinical trials.  4. Patients treated with COVID monoclonal antibody should continue to self-isolate and use infection control measures (e.g., wear mask, isolate, social distance, avoid sharing personal items, clean and disinfect "high touch" surfaces, and frequent handwashing) according to CDC guidelines.   5. The patient or parent/caregiver has the option to accept or refuse COVID monoclonal antibody treatment.  After reviewing this information with the patient, the patient has agreed  to receive one of the available covid 19 monoclonal antibodies and will be provided an appropriate fact sheet prior to infusion.  Sx onset 12/2. Set up for infusion on 12/9 @ 10:30am. Directions given to [facility]. Pt is aware that insurance will be charged an infusion fee. Pt is unvaccinated. + in epic.   Cline Crock 10/26/2020 9:59 AM

## 2020-10-27 ENCOUNTER — Telehealth: Payer: Self-pay

## 2020-10-27 NOTE — Telephone Encounter (Signed)
ILR alert received for 3 symptom events, 2 only present, need manual send. 2 events appears SR, 1 event appears SVT rate in 140's.   Patient reports on 09/21/20, she was on a ladder and looked up while going up, states everything went black. She was able to hold on to the ladder and states she couldn't see, everything was black. States she did not have loc. States when these events happen its often times when she moves her neck in certain positions. Reports of frequent headaches and dizziness that has worsened lately.   The other 2 events marked she described as a strange feeling in her chest/fluttering.   Patient is currently positive with covid. States she has been doing well. Reports of generalized weakness and sinus infection. Presenting VS 105.  ED precautions given if he has syncopal event. Agreeable with plan.  Advised I will forward to Dr. Graciela Husbands and we will call with changes.

## 2020-10-28 NOTE — Telephone Encounter (Signed)
  Hipolito Bayley   Ladies  I spoke with this lady about her spell >> syncope and complete heart block is an indication for pacing and I told her that we would tentatively schedule for 12.20 as she has been diagnosed with covid and is scheduled for infusion therapy tomorrow 12.9  I will reach out to ID to see when it would be that her risk of infecting others is minimal Mindi Junker  can you plan to call her tomorrow to followup on this please Thanks SK

## 2020-10-29 ENCOUNTER — Ambulatory Visit (HOSPITAL_COMMUNITY)
Admission: RE | Admit: 2020-10-29 | Discharge: 2020-10-29 | Disposition: A | Discharge status: Home / Self Care | Payer: Medicare Other | Source: Ambulatory Visit | Attending: Pulmonary Disease | Admitting: Pulmonary Disease

## 2020-10-29 DIAGNOSIS — U071 COVID-19: Secondary | ICD-10-CM | POA: Insufficient documentation

## 2020-10-29 DIAGNOSIS — Z23 Encounter for immunization: Secondary | ICD-10-CM | POA: Insufficient documentation

## 2020-10-29 DIAGNOSIS — R55 Syncope and collapse: Secondary | ICD-10-CM | POA: Insufficient documentation

## 2020-10-29 DIAGNOSIS — R54 Age-related physical debility: Secondary | ICD-10-CM | POA: Diagnosis present

## 2020-10-29 MED ORDER — ALBUTEROL SULFATE HFA 108 (90 BASE) MCG/ACT IN AERS: 2.0000 | INHALATION_SPRAY | Freq: Once | RESPIRATORY_TRACT | Status: DC | PRN

## 2020-10-29 MED ORDER — SODIUM CHLORIDE 0.9 % IV SOLN: INTRAVENOUS | Status: DC | PRN

## 2020-10-29 MED ORDER — METHYLPREDNISOLONE SODIUM SUCC 125 MG IJ SOLR: 125.0000 mg | Freq: Once | INTRAMUSCULAR | Status: DC | PRN

## 2020-10-29 MED ORDER — DIPHENHYDRAMINE HCL 50 MG/ML IJ SOLN: 50.0000 mg | Freq: Once | INTRAMUSCULAR | Status: DC | PRN

## 2020-10-29 MED ORDER — FAMOTIDINE IN NACL 20-0.9 MG/50ML-% IV SOLN: 20.0000 mg | Freq: Once | INTRAVENOUS | Status: DC | PRN

## 2020-10-29 MED ORDER — SODIUM CHLORIDE 0.9 % IV SOLN
1200.0000 mg | Freq: Once | INTRAVENOUS | Status: AC
  Administered 2020-10-29: 1200 mg via INTRAVENOUS

## 2020-10-29 MED ORDER — EPINEPHRINE 0.3 MG/0.3ML IJ SOAJ: 0.3000 mg | Freq: Once | INTRAMUSCULAR | Status: DC | PRN

## 2020-10-29 NOTE — Progress Notes (Signed)
Patient reviewed Fact Sheet for Patients, Parents, and Caregivers for Emergency Use Authorization (EUA) of Sotrovimab for the Treatment of Coronavirus. Patient also reviewed and is agreeable to the estimated cost of treatment. Patient is agreeable to proceed.   

## 2020-10-29 NOTE — Progress Notes (Signed)
  Diagnosis: COVID-19  Physician: Dr. Wright  Procedure: Covid Infusion Clinic Med: casirivimab\imdevimab infusion - Provided patient with casirivimab\imdevimab fact sheet for patients, parents and caregivers prior to infusion.  Complications: No immediate complications noted.  Discharge: Discharged home   Jasmine J McCullough 10/29/2020  

## 2020-10-29 NOTE — Discharge Instructions (Signed)
10 Things You Can Do to Manage Your COVID-19 Symptoms at Home If you have possible or confirmed COVID-19: 1. Stay home from work and school. And stay away from other public places. If you must go out, avoid using any kind of public transportation, ridesharing, or taxis. 2. Monitor your symptoms carefully. If your symptoms get worse, call your healthcare provider immediately. 3. Get rest and stay hydrated. 4. If you have a medical appointment, call the healthcare provider ahead of time and tell them that you have or may have COVID-19. 5. For medical emergencies, call 911 and notify the dispatch personnel that you have or may have COVID-19. 6. Cover your cough and sneezes with a tissue or use the inside of your elbow. 7. Wash your hands often with soap and water for at least 20 seconds or clean your hands with an alcohol-based hand sanitizer that contains at least 60% alcohol. 8. As much as possible, stay in a specific room and away from other people in your home. Also, you should use a separate bathroom, if available. If you need to be around other people in or outside of the home, wear a mask. 9. Avoid sharing personal items with other people in your household, like dishes, towels, and bedding. 10. Clean all surfaces that are touched often, like counters, tabletops, and doorknobs. Use household cleaning sprays or wipes according to the label instructions. cdc.gov/coronavirus 05/22/2019 This information is not intended to replace advice given to you by your health care provider. Make sure you discuss any questions you have with your health care provider. Document Revised: 10/24/2019 Document Reviewed: 10/24/2019 Elsevier Patient Education  2020 Elsevier Inc. What types of side effects do monoclonal antibody drugs cause?  Common side effects  In general, the more common side effects caused by monoclonal antibody drugs include: . Allergic reactions, such as hives or itching . Flu-like signs and  symptoms, including chills, fatigue, fever, and muscle aches and pains . Nausea, vomiting . Diarrhea . Skin rashes . Low blood pressure   The CDC is recommending patients who receive monoclonal antibody treatments wait at least 90 days before being vaccinated.  Currently, there are no data on the safety and efficacy of mRNA COVID-19 vaccines in persons who received monoclonal antibodies or convalescent plasma as part of COVID-19 treatment. Based on the estimated half-life of such therapies as well as evidence suggesting that reinfection is uncommon in the 90 days after initial infection, vaccination should be deferred for at least 90 days, as a precautionary measure until additional information becomes available, to avoid interference of the antibody treatment with vaccine-induced immune responses. If you have any questions or concerns after the infusion please call the Advanced Practice Provider on call at [phone]. This number is ONLY intended for your use regarding questions or concerns about the infusion post-treatment side-effects.  Please do not provide this number to others for use. For return to work notes please contact your primary care provider.   If someone you know is interested in receiving treatment please have them call the COVID hotline at [phone].   

## 2020-10-30 ENCOUNTER — Telehealth: Payer: Self-pay

## 2020-10-30 DIAGNOSIS — Z20822 Contact with and (suspected) exposure to covid-19: Secondary | ICD-10-CM | POA: Diagnosis not present

## 2020-10-30 NOTE — Telephone Encounter (Signed)
       I went in pt's chart to see who called her today, it was Rodman Key

## 2020-10-30 NOTE — Telephone Encounter (Signed)
Calling patient to assist with sending transmission.  Presenting: VS 100 bpm. 1 symptom event logged 10/30/20 @ 10:50 AM. No pauses noted. Reviewed with Dr. Graciela Husbands.   Advised patient I will review with Dr. Graciela Husbands.

## 2020-10-30 NOTE — Telephone Encounter (Signed)
Spoke with pt re: scheduling PPM implant for 11/09/20 per Dr Odessa Fleming recommendation.  Pt states her daughter who will be her caregiver will not be in town during that time and pt would like to postpone implant until 11/2020 when her daughter returns.  Will forward information to Dr Graciela Husbands for review.  Pt verbalizes understanding and agrees with current plan.

## 2020-10-30 NOTE — Telephone Encounter (Signed)
Spoke with pt who states she had another episode today of feeling as if she was going to pass out which seemed to last for "a while"  She reports this has actually happened a total of three times today.  Pt advised she will need to send a transmission from her loop recorder.  Pt states she will need assistance with this.  Will forward information to Device clinic RN for further assist.  Reviewed ED precautions with pt who verbalizes understanding and agrees with current plan.

## 2020-11-03 ENCOUNTER — Ambulatory Visit (INDEPENDENT_AMBULATORY_CARE_PROVIDER_SITE_OTHER): Payer: Medicare PPO | Admitting: Internal Medicine

## 2020-11-04 NOTE — Progress Notes (Signed)
Carelink Summary Report / Loop Recorder 

## 2020-11-06 ENCOUNTER — Encounter (INDEPENDENT_AMBULATORY_CARE_PROVIDER_SITE_OTHER): Payer: Self-pay | Admitting: Internal Medicine

## 2020-11-09 ENCOUNTER — Other Ambulatory Visit (INDEPENDENT_AMBULATORY_CARE_PROVIDER_SITE_OTHER): Payer: Self-pay | Admitting: Internal Medicine

## 2020-11-10 ENCOUNTER — Telehealth: Payer: Self-pay

## 2020-11-10 NOTE — Telephone Encounter (Signed)
Spoke with Kathleen Reed regarding scheduling PPM implant.  Kathleen Reed states she would like to discuss implant with Dr Graciela Husbands prior to scheduling.  Appointment scheduled for 12/03/2020 at 2pm.  Kathleen Reed verbalizes understanding and agrees with current plan.

## 2020-11-16 DIAGNOSIS — M9902 Segmental and somatic dysfunction of thoracic region: Secondary | ICD-10-CM | POA: Diagnosis not present

## 2020-11-16 DIAGNOSIS — M9905 Segmental and somatic dysfunction of pelvic region: Secondary | ICD-10-CM | POA: Diagnosis not present

## 2020-11-16 DIAGNOSIS — M5441 Lumbago with sciatica, right side: Secondary | ICD-10-CM | POA: Diagnosis not present

## 2020-11-16 DIAGNOSIS — M9901 Segmental and somatic dysfunction of cervical region: Secondary | ICD-10-CM | POA: Diagnosis not present

## 2020-11-16 DIAGNOSIS — M542 Cervicalgia: Secondary | ICD-10-CM | POA: Diagnosis not present

## 2020-11-16 DIAGNOSIS — M9903 Segmental and somatic dysfunction of lumbar region: Secondary | ICD-10-CM | POA: Diagnosis not present

## 2020-11-25 DIAGNOSIS — Z20828 Contact with and (suspected) exposure to other viral communicable diseases: Secondary | ICD-10-CM | POA: Diagnosis not present

## 2020-11-26 ENCOUNTER — Telehealth: Payer: Self-pay

## 2020-11-26 LAB — CUP PACEART REMOTE DEVICE CHECK
Date Time Interrogation Session: 20220105125358
Implantable Pulse Generator Implant Date: 20210621

## 2020-11-26 NOTE — Telephone Encounter (Signed)
M. Good morning.  Could you let her know that we will continue to monitor l, but no serious arrhythmias we're noted.   Thx. Sk

## 2020-11-26 NOTE — Telephone Encounter (Signed)
ILR summary report received for 2 symptoms events. Both appear ST in the 120's. Patient reports a brief episode of faint and chest discomfort. States it is quickly resolved within a second or two. Compliant with all medications. Advised I will forward to Dr. Graciela Husbands and we will call with changes.

## 2020-11-30 ENCOUNTER — Ambulatory Visit (INDEPENDENT_AMBULATORY_CARE_PROVIDER_SITE_OTHER): Payer: Medicare PPO

## 2020-11-30 DIAGNOSIS — R55 Syncope and collapse: Secondary | ICD-10-CM | POA: Diagnosis not present

## 2020-12-01 ENCOUNTER — Other Ambulatory Visit (INDEPENDENT_AMBULATORY_CARE_PROVIDER_SITE_OTHER): Payer: Self-pay | Admitting: Internal Medicine

## 2020-12-03 ENCOUNTER — Telehealth (INDEPENDENT_AMBULATORY_CARE_PROVIDER_SITE_OTHER): Payer: Medicare PPO | Admitting: Internal Medicine

## 2020-12-03 ENCOUNTER — Encounter: Payer: Self-pay | Admitting: Internal Medicine

## 2020-12-03 VITALS — BP 160/75 | HR 95 | Wt 137.0 lb

## 2020-12-03 DIAGNOSIS — R42 Dizziness and giddiness: Secondary | ICD-10-CM | POA: Diagnosis not present

## 2020-12-03 DIAGNOSIS — R55 Syncope and collapse: Secondary | ICD-10-CM

## 2020-12-03 DIAGNOSIS — I447 Left bundle-branch block, unspecified: Secondary | ICD-10-CM | POA: Diagnosis not present
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
## 2020-12-03 NOTE — Patient Instructions (Signed)
Medication Instructions:  Your physician recommends that you continue on your current medications as directed. Please refer to the Current Medication list given to you today.  *If you need a refill on your cardiac medications before your next appointment, please call your pharmacy*   Lab Work: none If you have labs (blood work) drawn today and your tests are completely normal, you will receive your results only by: Marland Kitchen MyChart Message (if you have MyChart) OR . A paper copy in the mail If you have any lab test that is abnormal or we need to change your treatment, we will call you to review the results.   Testing/Procedures: Loop recorder implant... Dr. Odessa Fleming nurse Mindi Junker to reach out to you.    Follow-Up: At [facility], you and your health needs are our priority.  As part of our continuing mission to provide you with exceptional heart care, we have created designated Provider Care Teams.  These Care Teams include your primary Cardiologist (physician) and Advanced Practice Providers (APPs -  Physician Assistants and Nurse Practitioners) who all work together to provide you with the care you need, when you need it.  We recommend signing up for the patient portal called "MyChart".  Sign up information is provided on this After Visit Summary.  MyChart is used to connect with patients for Virtual Visits (Telemedicine).  Patients are able to view lab/test results, encounter notes, upcoming appointments, etc.  Non-urgent messages can be sent to your provider as well.   To learn more about what you can do with MyChart, go to [email or web address].    Your next appointment:  Mindi Junker RN will call you for the details of your Loop Recorder.

## 2020-12-08 ENCOUNTER — Telehealth: Payer: Self-pay | Admitting: Internal Medicine

## 2020-12-08 DIAGNOSIS — R55 Syncope and collapse: Secondary | ICD-10-CM

## 2020-12-08 DIAGNOSIS — I455 Other specified heart block: Secondary | ICD-10-CM

## 2020-12-08 DIAGNOSIS — Z01812 Encounter for preprocedural laboratory examination: Secondary | ICD-10-CM

## 2020-12-08 NOTE — Telephone Encounter (Signed)
Patient's VV on 12/03/20 has been marked as "No Show"   Can we fix this? Patient sent message to scheduling.

## 2020-12-13 NOTE — Progress Notes (Signed)
Carelink Summary Report / Loop Recorder 

## 2020-12-14 ENCOUNTER — Encounter (INDEPENDENT_AMBULATORY_CARE_PROVIDER_SITE_OTHER): Payer: Self-pay | Admitting: Internal Medicine

## 2020-12-14 ENCOUNTER — Other Ambulatory Visit: Payer: Self-pay

## 2020-12-14 ENCOUNTER — Ambulatory Visit (INDEPENDENT_AMBULATORY_CARE_PROVIDER_SITE_OTHER): Payer: Medicare PPO | Admitting: Internal Medicine

## 2020-12-14 VITALS — BP 122/78 | HR 87 | Temp 97.3°F | Ht 65.0 in | Wt 137.2 lb

## 2020-12-14 DIAGNOSIS — E039 Hypothyroidism, unspecified: Secondary | ICD-10-CM

## 2020-12-14 DIAGNOSIS — N951 Menopausal and female climacteric states: Secondary | ICD-10-CM

## 2020-12-14 DIAGNOSIS — E782 Mixed hyperlipidemia: Secondary | ICD-10-CM | POA: Diagnosis not present
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
## 2020-12-16 ENCOUNTER — Telehealth (INDEPENDENT_AMBULATORY_CARE_PROVIDER_SITE_OTHER): Payer: Self-pay

## 2020-12-16 ENCOUNTER — Other Ambulatory Visit (INDEPENDENT_AMBULATORY_CARE_PROVIDER_SITE_OTHER): Payer: Self-pay | Admitting: Internal Medicine

## 2020-12-16 DIAGNOSIS — E039 Hypothyroidism, unspecified: Secondary | ICD-10-CM

## 2020-12-16 DIAGNOSIS — N951 Menopausal and female climacteric states: Secondary | ICD-10-CM

## 2020-12-16 NOTE — Telephone Encounter (Signed)
Yes, this will be fine.

## 2020-12-16 NOTE — Telephone Encounter (Signed)
Patient called and left a detailed voice message that she thinks her Armour Thyroid 120 mg dosage may be too high because she is having insomnia and it is getting worse and she forgot to mention this when she was recently in for a visit. Patient is asking if she needs to go back on the 90 mg or if she needs to change up her dosage to help her sleep better?  Please advise.

## 2020-12-16 NOTE — Telephone Encounter (Signed)
If she takes the Armour Thyroid in the morning, it is unlikely that it would cause her to have insomnia.  However, if she believes this, then she can simply stop taking the medicine for 2 weeks and see if the insomnia resolves completely.  If it does, then she should go back on the Armour Thyroid and see if it causes her to have insomnia again.  If this is the case, then she is correct that the Armour Thyroid was causing the problem in the first place. If she does not want to stop the Armour Thyroid, I suggested that she use melatonin for insomnia.  I would recommend www.lifeextension.com and look for melatonin 1 mg capsules that she can take.

## 2020-12-16 NOTE — Telephone Encounter (Signed)
Patient stated that she can only go after 5pm after she gets off of work. She wants to know if this time is okay?

## 2020-12-16 NOTE — Telephone Encounter (Signed)
I the patient know that I put the order in for the thyroid test as well as estradiol and progesterone levels which I want her to have checked also.  Make sure that she goes for the blood test in the afternoon around 1 PM, having taken the thyroid in the morning, estradiol in the morning and progesterone the previous night.

## 2020-12-16 NOTE — Telephone Encounter (Signed)
Called patient and gave her the message from Dr. Karilyn Cota. Patient stated that she already uses life extensions and she is currently taking the Melatonin 3 mg lozenges. Patient does not want to stop the Armour Thyroid yet and is wondering if the dose change is just too much for her. She stated that she would feel much more comfortable with taking a 90 mg with a 15 mg instead of taking the 120 mg and she did want to check her labs before making any changes to see what you recommend.  Patient wants to have her labs done and she is requesting if you can place the orders and she will go to the [facility] across from [facility] for her labs to be drawn after work.  Please advise.

## 2020-12-16 NOTE — Telephone Encounter (Signed)
This was answered in a different telephone message.

## 2020-12-17 DIAGNOSIS — N951 Menopausal and female climacteric states: Secondary | ICD-10-CM | POA: Diagnosis not present

## 2020-12-17 DIAGNOSIS — E039 Hypothyroidism, unspecified: Secondary | ICD-10-CM | POA: Diagnosis not present

## 2020-12-18 ENCOUNTER — Other Ambulatory Visit (HOSPITAL_COMMUNITY): Payer: Medicare PPO

## 2020-12-18 ENCOUNTER — Other Ambulatory Visit: Payer: Medicare PPO | Admitting: *Deleted

## 2020-12-18 ENCOUNTER — Other Ambulatory Visit: Payer: Self-pay

## 2020-12-18 DIAGNOSIS — R55 Syncope and collapse: Secondary | ICD-10-CM

## 2020-12-18 DIAGNOSIS — Z01812 Encounter for preprocedural laboratory examination: Secondary | ICD-10-CM | POA: Diagnosis not present

## 2020-12-18 DIAGNOSIS — I455 Other specified heart block: Secondary | ICD-10-CM

## 2020-12-18 LAB — BASIC METABOLIC PANEL
BUN/Creatinine Ratio: 15 (ref 12–28)
BUN: 11 mg/dL (ref 8–27)
CO2: 24 mmol/L (ref 20–29)
Calcium: 9.1 mg/dL (ref 8.7–10.3)
Chloride: 105 mmol/L (ref 96–106)
Creatinine, Ser: 0.73 mg/dL (ref 0.57–1.00)
GFR calc Af Amer: 99 mL/min/{1.73_m2} (ref >59)
GFR calc non Af Amer: 86 mL/min/{1.73_m2} (ref >59)
Glucose: 93 mg/dL (ref 65–99)
Potassium: 4.4 mmol/L (ref 3.5–5.2)
Sodium: 142 mmol/L (ref 134–144)

## 2020-12-18 LAB — CBC
Hematocrit: 36.7 % (ref 34.0–46.6)
Hemoglobin: 12.4 g/dL (ref 11.1–15.9)
MCH: 31 pg (ref 26.6–33.0)
MCHC: 33.8 g/dL (ref 31.5–35.7)
MCV: 92 fL (ref 79–97)
Platelets: 244 10*3/uL (ref 150–450)
RBC: 4 x10E6/uL (ref 3.77–5.28)
RDW: 11.9 % (ref 11.7–15.4)
WBC: 6.1 10*3/uL (ref 3.4–10.8)

## 2020-12-18 LAB — T3, FREE: T3, Free: 7.7 pg/mL — ABNORMAL HIGH (ref 2.3–4.2)

## 2020-12-18 LAB — ESTRADIOL: Estradiol: 22 pg/mL

## 2020-12-18 LAB — PROGESTERONE: Progesterone: 5.8 ng/mL

## 2020-12-18 LAB — T4, FREE: Free T4: 1.5 ng/dL (ref 0.8–1.8)

## 2020-12-18 LAB — TSH: TSH: 0.01 mIU/L — ABNORMAL LOW (ref 0.40–4.50)

## 2020-12-21 ENCOUNTER — Encounter (HOSPITAL_COMMUNITY)
Admission: RE | Disposition: A | Discharge status: Home / Self Care | Payer: Self-pay | Source: Home / Self Care | Attending: Internal Medicine

## 2020-12-21 ENCOUNTER — Ambulatory Visit (HOSPITAL_COMMUNITY)
Admission: RE | Admit: 2020-12-21 | Discharge: 2020-12-21 | Disposition: A | Discharge status: Home / Self Care | Payer: Medicare PPO | Attending: Internal Medicine | Admitting: Internal Medicine

## 2020-12-21 ENCOUNTER — Other Ambulatory Visit: Payer: Self-pay

## 2020-12-21 DIAGNOSIS — I442 Atrioventricular block, complete: Secondary | ICD-10-CM | POA: Diagnosis not present

## 2020-12-21 DIAGNOSIS — E0590 Thyrotoxicosis, unspecified without thyrotoxic crisis or storm: Secondary | ICD-10-CM | POA: Diagnosis not present

## 2020-12-21 DIAGNOSIS — R55 Syncope and collapse: Secondary | ICD-10-CM | POA: Insufficient documentation

## 2020-12-21 DIAGNOSIS — R6884 Jaw pain: Secondary | ICD-10-CM | POA: Insufficient documentation

## 2020-12-21 DIAGNOSIS — Z539 Procedure and treatment not carried out, unspecified reason: Secondary | ICD-10-CM | POA: Diagnosis not present

## 2020-12-21 DIAGNOSIS — K047 Periapical abscess without sinus: Secondary | ICD-10-CM | POA: Diagnosis not present

## 2020-12-21 DIAGNOSIS — Z87891 Personal history of nicotine dependence: Secondary | ICD-10-CM | POA: Insufficient documentation

## 2020-12-21 DIAGNOSIS — Z79899 Other long term (current) drug therapy: Secondary | ICD-10-CM | POA: Insufficient documentation

## 2020-12-21 DIAGNOSIS — Z885 Allergy status to narcotic agent status: Secondary | ICD-10-CM | POA: Insufficient documentation

## 2020-12-21 DIAGNOSIS — Z79890 Hormone replacement therapy (postmenopausal): Secondary | ICD-10-CM | POA: Insufficient documentation

## 2020-12-21 DIAGNOSIS — Z880 Allergy status to penicillin: Secondary | ICD-10-CM | POA: Insufficient documentation

## 2020-12-21 DIAGNOSIS — Z959 Presence of cardiac and vascular implant and graft, unspecified: Secondary | ICD-10-CM

## 2020-12-21 SURGERY — PACEMAKER IMPLANT

## 2020-12-21 MED ORDER — CHLORHEXIDINE GLUCONATE 4 % EX LIQD: 4.0000 "application " | Freq: Once | CUTANEOUS | Status: DC

## 2020-12-21 MED ORDER — SODIUM CHLORIDE 0.9 % IV SOLN: INTRAVENOUS | Status: DC

## 2020-12-21 MED ORDER — VANCOMYCIN HCL IN DEXTROSE 1-5 GM/200ML-% IV SOLN: 1000.0000 mg | INTRAVENOUS | Status: DC

## 2020-12-21 MED ORDER — POVIDONE-IODINE 10 % EX SWAB: 2.0000 "application " | Freq: Once | CUTANEOUS | Status: DC

## 2020-12-21 MED ORDER — SODIUM CHLORIDE 0.9 % IV SOLN
80.0000 mg | INTRAVENOUS | Status: DC
  Filled 2020-12-21: qty 2
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
## 2020-12-21 NOTE — Progress Notes (Signed)
Pt c/o jaw pain procedure cancelled

## 2020-12-21 NOTE — Telephone Encounter (Signed)
Kathleen Reed  good  am  She came intoday with a tooth ache and pain on her gum   I canceled the procedure and she was to see her dentist We will need to reschedule her  We can discuss times tomorrow Thanks SK

## 2020-12-22 ENCOUNTER — Other Ambulatory Visit (INDEPENDENT_AMBULATORY_CARE_PROVIDER_SITE_OTHER): Payer: Self-pay | Admitting: Internal Medicine

## 2020-12-22 ENCOUNTER — Encounter (INDEPENDENT_AMBULATORY_CARE_PROVIDER_SITE_OTHER): Payer: Self-pay | Admitting: Internal Medicine

## 2020-12-22 MED ORDER — ARMOUR THYROID 90 MG PO TABS
90.0000 mg | ORAL_TABLET | Freq: Every day | ORAL | 3 refills | Status: DC
Start: 2020-12-22 — End: 2021-04-23

## 2020-12-22 MED ORDER — ARMOUR THYROID 15 MG PO TABS
15.0000 mg | ORAL_TABLET | Freq: Every day | ORAL | 3 refills | Status: DC
Start: 2020-12-22 — End: 2021-04-23

## 2020-12-25 ENCOUNTER — Telehealth: Payer: Self-pay

## 2020-12-25 NOTE — Telephone Encounter (Signed)
Spoke with pt Kathleen Reed of PPM implant.  Pt request implant be rescheduled to 01/18/2021.  Pt advised will schedule and contact pt next week to review instructions.  Pt verbalized understanding and thanked Charity fundraiser for call.

## 2020-12-30 LAB — CUP PACEART REMOTE DEVICE CHECK
Date Time Interrogation Session: 20220207130301
Implantable Pulse Generator Implant Date: 20210621

## 2020-12-31 ENCOUNTER — Ambulatory Visit: Payer: Medicare PPO

## 2020-12-31 ENCOUNTER — Other Ambulatory Visit: Payer: Self-pay | Admitting: Physician Assistant

## 2020-12-31 ENCOUNTER — Telehealth: Payer: Self-pay

## 2020-12-31 DIAGNOSIS — K824 Cholesterolosis of gallbladder: Secondary | ICD-10-CM

## 2020-12-31 NOTE — Telephone Encounter (Signed)
Called patient to advise wound check not needed today since implant procedure did not occur.  Appt has been cancelled and will need to be rebooked once procedure is scheduled.

## 2021-01-04 ENCOUNTER — Ambulatory Visit (INDEPENDENT_AMBULATORY_CARE_PROVIDER_SITE_OTHER): Payer: Medicare PPO

## 2021-01-04 DIAGNOSIS — R55 Syncope and collapse: Secondary | ICD-10-CM | POA: Diagnosis not present

## 2021-01-06 DIAGNOSIS — R55 Syncope and collapse: Secondary | ICD-10-CM

## 2021-01-06 DIAGNOSIS — Z01812 Encounter for preprocedural laboratory examination: Secondary | ICD-10-CM

## 2021-01-06 DIAGNOSIS — R42 Dizziness and giddiness: Secondary | ICD-10-CM

## 2021-01-06 NOTE — Telephone Encounter (Signed)
Spoke with pt to review PPM instructions for 01/18/2021 procedure with Dr Graciela Husbands.  Pt states she wishes to cancel procedure and does not want to reschedule at this time.  Pt reports she has seen a spinal specialist who believes she could be having some compression of her brain stem which is causing her syncopal episodes.  Pt states she would like to investigate this further and continue to gather information from her Loop Recorder.  Pt states she will contact Dr Graciela Husbands if and when she is ready to schedule PPM implant.  Surgery canceled as pt requested along with lab appointment.  Will forward information to Dr Graciela Husbands for review.

## 2021-01-07 NOTE — Progress Notes (Signed)
Carelink Summary Report / Loop Recorder 

## 2021-01-08 ENCOUNTER — Ambulatory Visit
Admission: RE | Admit: 2021-01-08 | Discharge: 2021-01-08 | Disposition: A | Discharge status: Home / Self Care | Payer: Medicare PPO | Source: Ambulatory Visit | Attending: Physician Assistant | Admitting: Physician Assistant

## 2021-01-08 DIAGNOSIS — K824 Cholesterolosis of gallbladder: Secondary | ICD-10-CM

## 2021-01-13 NOTE — Progress Notes (Signed)
Pt is having some issues  due to headaches . But wants to see you next OV before change /increase of medications.

## 2021-01-14 NOTE — Telephone Encounter (Signed)
Pt requests that 01/18/2021 PPM implant be canceled.  Pt will contact Dr Graciela Husbands when and if she decides to reschedule.  Dr Graciela Husbands notified of pt's decision.

## 2021-01-15 ENCOUNTER — Other Ambulatory Visit: Payer: Medicare PPO

## 2021-01-18 ENCOUNTER — Encounter (HOSPITAL_COMMUNITY): Payer: Self-pay

## 2021-01-18 ENCOUNTER — Ambulatory Visit (HOSPITAL_COMMUNITY): Admit: 2021-01-18 | Payer: Medicare PPO | Admitting: Internal Medicine

## 2021-01-18 SURGERY — PACEMAKER IMPLANT

## 2021-01-26 ENCOUNTER — Encounter (INDEPENDENT_AMBULATORY_CARE_PROVIDER_SITE_OTHER): Payer: Self-pay | Admitting: Internal Medicine

## 2021-01-26 ENCOUNTER — Ambulatory Visit (INDEPENDENT_AMBULATORY_CARE_PROVIDER_SITE_OTHER): Payer: Medicare PPO | Admitting: Internal Medicine

## 2021-01-26 ENCOUNTER — Other Ambulatory Visit: Payer: Self-pay

## 2021-01-26 VITALS — BP 98/68 | HR 94 | Temp 96.4°F | Ht 65.0 in | Wt 138.2 lb

## 2021-01-26 DIAGNOSIS — E039 Hypothyroidism, unspecified: Secondary | ICD-10-CM

## 2021-01-26 DIAGNOSIS — N951 Menopausal and female climacteric states: Secondary | ICD-10-CM | POA: Diagnosis not present

## 2021-01-26 MED ORDER — BACLOFEN 10 MG PO TABS: 10.0000 mg | ORAL_TABLET | Freq: Three times a day (TID) | ORAL | 0 refills | Status: DC
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
## 2021-01-27 LAB — PROGESTERONE: Progesterone: 4.5 ng/mL

## 2021-01-27 LAB — ESTRADIOL: Estradiol: 21 pg/mL

## 2021-01-28 ENCOUNTER — Encounter (INDEPENDENT_AMBULATORY_CARE_PROVIDER_SITE_OTHER): Payer: Self-pay | Admitting: Internal Medicine

## 2021-02-01 ENCOUNTER — Other Ambulatory Visit (INDEPENDENT_AMBULATORY_CARE_PROVIDER_SITE_OTHER): Payer: Self-pay | Admitting: Internal Medicine

## 2021-02-04 LAB — CUP PACEART REMOTE DEVICE CHECK
Date Time Interrogation Session: 20220312130353
Implantable Pulse Generator Implant Date: 20210621

## 2021-02-08 ENCOUNTER — Ambulatory Visit (INDEPENDENT_AMBULATORY_CARE_PROVIDER_SITE_OTHER): Payer: Medicare PPO

## 2021-02-08 DIAGNOSIS — R55 Syncope and collapse: Secondary | ICD-10-CM | POA: Diagnosis not present

## 2021-02-15 NOTE — Progress Notes (Signed)
Carelink Summary Report / Loop Recorder 

## 2021-02-26 ENCOUNTER — Other Ambulatory Visit (INDEPENDENT_AMBULATORY_CARE_PROVIDER_SITE_OTHER): Payer: Self-pay | Admitting: Internal Medicine

## 2021-03-04 ENCOUNTER — Encounter: Payer: Medicare PPO | Admitting: Internal Medicine

## 2021-03-04 ENCOUNTER — Ambulatory Visit: Payer: Medicare PPO

## 2021-03-05 ENCOUNTER — Ambulatory Visit (INDEPENDENT_AMBULATORY_CARE_PROVIDER_SITE_OTHER): Payer: Medicare PPO

## 2021-03-05 DIAGNOSIS — R55 Syncope and collapse: Secondary | ICD-10-CM

## 2021-03-09 LAB — CUP PACEART REMOTE DEVICE CHECK
Date Time Interrogation Session: 20220414140848
Implantable Pulse Generator Implant Date: 20210621

## 2021-03-22 NOTE — Progress Notes (Signed)
Carelink Summary Report / Loop Recorder 

## 2021-03-23 ENCOUNTER — Telehealth (INDEPENDENT_AMBULATORY_CARE_PROVIDER_SITE_OTHER): Payer: Self-pay

## 2021-03-23 NOTE — Telephone Encounter (Signed)
Received a detailed voice message from K[place]ler from [place] as patient had a scan done and is requesting that we receive her results. Tresa Endo has encouraged the patient to send the images through her MyChart however patient wants them to send to [place] in an email so we can have the digital images. I let Tresa Endo know that we do not have an email to send to [place] and I will double check with Dr. Karilyn Cota.  Tresa Endo can be reached at [phone] [email or web address]  Please advise.

## 2021-03-23 NOTE — Telephone Encounter (Signed)
I do not need the digital images because I will probably not be able to interpret them.  A report will be sufficient so this can be faxed to [place].

## 2021-03-25 ENCOUNTER — Ambulatory Visit: Payer: Medicare PPO | Attending: Critical Care Medicine

## 2021-03-25 DIAGNOSIS — Z20822 Contact with and (suspected) exposure to covid-19: Secondary | ICD-10-CM

## 2021-03-26 LAB — NOVEL CORONAVIRUS, NAA: SARS-CoV-2, NAA: NOT DETECTED

## 2021-03-26 LAB — SARS-COV-2, NAA 2 DAY TAT

## 2021-03-27 DIAGNOSIS — R55 Syncope and collapse: Secondary | ICD-10-CM | POA: Diagnosis not present

## 2021-03-30 ENCOUNTER — Encounter: Payer: Medicare PPO | Admitting: Internal Medicine

## 2021-03-31 DIAGNOSIS — Z20828 Contact with and (suspected) exposure to other viral communicable diseases: Secondary | ICD-10-CM | POA: Diagnosis not present

## 2021-04-01 ENCOUNTER — Other Ambulatory Visit: Payer: Medicare PPO

## 2021-04-01 DIAGNOSIS — Z1159 Encounter for screening for other viral diseases: Secondary | ICD-10-CM | POA: Diagnosis not present

## 2021-04-07 ENCOUNTER — Ambulatory Visit (INDEPENDENT_AMBULATORY_CARE_PROVIDER_SITE_OTHER): Payer: Medicare PPO

## 2021-04-07 DIAGNOSIS — R55 Syncope and collapse: Secondary | ICD-10-CM

## 2021-04-08 DIAGNOSIS — Z1159 Encounter for screening for other viral diseases: Secondary | ICD-10-CM | POA: Diagnosis not present

## 2021-04-08 LAB — CUP PACEART REMOTE DEVICE CHECK
Date Time Interrogation Session: 20220517140929
Implantable Pulse Generator Implant Date: 20210621
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
## 2021-04-15 ENCOUNTER — Other Ambulatory Visit: Payer: Self-pay

## 2021-04-15 ENCOUNTER — Other Ambulatory Visit (INDEPENDENT_AMBULATORY_CARE_PROVIDER_SITE_OTHER): Payer: Self-pay | Admitting: Internal Medicine

## 2021-04-15 ENCOUNTER — Ambulatory Visit (INDEPENDENT_AMBULATORY_CARE_PROVIDER_SITE_OTHER): Payer: Medicare PPO | Admitting: Internal Medicine

## 2021-04-15 ENCOUNTER — Ambulatory Visit (INDEPENDENT_AMBULATORY_CARE_PROVIDER_SITE_OTHER): Payer: Medicare PPO | Admitting: Cardiology

## 2021-04-15 ENCOUNTER — Encounter (INDEPENDENT_AMBULATORY_CARE_PROVIDER_SITE_OTHER): Payer: Self-pay | Admitting: Internal Medicine

## 2021-04-15 ENCOUNTER — Encounter: Payer: Self-pay | Admitting: Cardiology

## 2021-04-15 VITALS — BP 128/60 | HR 78 | Ht 65.0 in | Wt 136.0 lb

## 2021-04-15 VITALS — Temp 97.3°F | Resp 18 | Ht 65.5 in | Wt 138.0 lb

## 2021-04-15 DIAGNOSIS — R0981 Nasal congestion: Secondary | ICD-10-CM | POA: Diagnosis not present

## 2021-04-15 DIAGNOSIS — R55 Syncope and collapse: Secondary | ICD-10-CM

## 2021-04-15 DIAGNOSIS — R9431 Abnormal electrocardiogram [ECG] [EKG]: Secondary | ICD-10-CM

## 2021-04-15 DIAGNOSIS — I6523 Occlusion and stenosis of bilateral carotid arteries: Secondary | ICD-10-CM | POA: Diagnosis not present

## 2021-04-15 NOTE — Patient Instructions (Signed)
Medication Instructions:  No Changes In Medications at this time.  *If you need a refill on your cardiac medications before your next appointment, please call your pharmacy*  Testing/Procedures: Dr. Antoine Poche has ordered a CT coronary calcium score. This test is done at [address] [address]. This is $99 out of pocket.  Coronary CalciumScan A coronary calcium scan is an imaging test used to look for deposits of calcium and other fatty materials (plaques) in the inner lining of the blood vessels of the heart (coronary arteries). These deposits of calcium and plaques can partly clog and narrow the coronary arteries without producing any symptoms or warning signs. This puts a person at risk for a heart attack. This test can detect these deposits before symptoms develop. Tell a health care provider about:  Any allergies you have.  All medicines you are taking, including vitamins, herbs, eye drops, creams, and over-the-counter medicines.  Any problems you or family members have had with anesthetic medicines.  Any blood disorders you have.  Any surgeries you have had.  Any medical conditions you have.  Whether you are pregnant or may be pregnant. What are the risks? Generally, this is a safe procedure. However, problems may occur, including:  Harm to a pregnant woman and her unborn baby. This test involves the use of radiation. Radiation exposure can be dangerous to a pregnant woman and her unborn baby. If you are pregnant, you generally should not have this procedure done.  Slight increase in the risk of cancer. This is because of the radiation involved in the test. What happens before the procedure? No preparation is needed for this procedure. What happens during the procedure?  You will undress and remove any jewelry around your neck or chest.  You will put on a hospital gown.  Sticky electrodes will be placed on your chest. The electrodes will be connected to an  electrocardiogram (ECG) machine to record a tracing of the electrical activity of your heart.  A CT scanner will take pictures of your heart. During this time, you will be asked to lie still and hold your breath for 2-3 seconds while a picture of your heart is being taken. The procedure may vary among health care providers and hospitals. What happens after the procedure?  You can get dressed.  You can return to your normal activities.  It is up to you to get the results of your test. Ask your health care provider, or the department that is doing the test, when your results will be ready. Summary  A coronary calcium scan is an imaging test used to look for deposits of calcium and other fatty materials (plaques) in the inner lining of the blood vessels of the heart (coronary arteries).  Generally, this is a safe procedure. Tell your health care provider if you are pregnant or may be pregnant.  No preparation is needed for this procedure.  A CT scanner will take pictures of your heart.  You can return to your normal activities after the scan is done. This information is not intended to replace advice given to you by your health care provider. Make sure you discuss any questions you have with your health care provider. Document Released: 05/05/2008 Document Revised: 09/26/2016 Document Reviewed: 09/26/2016 Elsevier Interactive Patient Education  2017 [employer].  Your physician has requested that you have a carotid duplex. This test is an ultrasound of the carotid arteries in your neck. It looks at blood flow through these arteries  that supply the brain with blood. Allow one hour for this exam. There are no restrictions or special instructions.  Follow-Up: At [facility], you and your health needs are our priority.  As part of our continuing mission to provide you with exceptional heart care, we have created designated Provider Care Teams.  These Care Teams include your primary  Cardiologist (physician) and Advanced Practice Providers (APPs -  Physician Assistants and Nurse Practitioners) who all work together to provide you with the care you need, when you need it.  Your next appointment:   1 year(s)  The format for your next appointment:   In Person  Provider:   Rollene Rotunda, MD
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
## 2021-04-23 ENCOUNTER — Other Ambulatory Visit (INDEPENDENT_AMBULATORY_CARE_PROVIDER_SITE_OTHER): Payer: Self-pay | Admitting: Internal Medicine

## 2021-04-27 ENCOUNTER — Ambulatory Visit (HOSPITAL_COMMUNITY)
Admission: RE | Admit: 2021-04-27 | Discharge: 2021-04-27 | Disposition: A | Discharge status: Home / Self Care | Payer: Medicare PPO | Source: Ambulatory Visit | Attending: Internal Medicine | Admitting: Internal Medicine

## 2021-04-27 ENCOUNTER — Other Ambulatory Visit: Payer: Self-pay

## 2021-04-27 DIAGNOSIS — R9431 Abnormal electrocardiogram [ECG] [EKG]: Secondary | ICD-10-CM | POA: Diagnosis not present

## 2021-04-27 DIAGNOSIS — R55 Syncope and collapse: Secondary | ICD-10-CM | POA: Insufficient documentation

## 2021-04-27 DIAGNOSIS — I6523 Occlusion and stenosis of bilateral carotid arteries: Secondary | ICD-10-CM | POA: Diagnosis not present

## 2021-04-28 ENCOUNTER — Encounter (INDEPENDENT_AMBULATORY_CARE_PROVIDER_SITE_OTHER): Payer: Self-pay | Admitting: Internal Medicine

## 2021-04-29 DIAGNOSIS — Z20828 Contact with and (suspected) exposure to other viral communicable diseases: Secondary | ICD-10-CM | POA: Diagnosis not present

## 2021-04-29 NOTE — Progress Notes (Signed)
Carelink Summary Report / Loop Recorder 

## 2021-05-10 ENCOUNTER — Ambulatory Visit (INDEPENDENT_AMBULATORY_CARE_PROVIDER_SITE_OTHER): Payer: Medicare PPO

## 2021-05-10 DIAGNOSIS — R55 Syncope and collapse: Secondary | ICD-10-CM

## 2021-05-11 LAB — CUP PACEART REMOTE DEVICE CHECK
Date Time Interrogation Session: 20220619141201
Implantable Pulse Generator Implant Date: 20210621

## 2021-05-18 ENCOUNTER — Ambulatory Visit (INDEPENDENT_AMBULATORY_CARE_PROVIDER_SITE_OTHER)
Admission: RE | Admit: 2021-05-18 | Discharge: 2021-05-18 | Disposition: A | Discharge status: Home / Self Care | Payer: Self-pay | Source: Ambulatory Visit | Attending: Cardiology | Admitting: Cardiology

## 2021-05-18 ENCOUNTER — Other Ambulatory Visit: Payer: Self-pay

## 2021-05-18 DIAGNOSIS — R55 Syncope and collapse: Secondary | ICD-10-CM

## 2021-05-25 ENCOUNTER — Other Ambulatory Visit (INDEPENDENT_AMBULATORY_CARE_PROVIDER_SITE_OTHER): Payer: Self-pay | Admitting: Internal Medicine

## 2021-05-26 ENCOUNTER — Telehealth (INDEPENDENT_AMBULATORY_CARE_PROVIDER_SITE_OTHER): Payer: Self-pay

## 2021-05-26 DIAGNOSIS — E039 Hypothyroidism, unspecified: Secondary | ICD-10-CM

## 2021-05-26 MED ORDER — ARMOUR THYROID 90 MG PO TABS: 90.0000 mg | ORAL_TABLET | Freq: Every day | ORAL | 0 refills | Status: DC

## 2021-05-26 MED ORDER — ARMOUR THYROID 15 MG PO TABS: 15.0000 mg | ORAL_TABLET | Freq: Every day | ORAL | 0 refills | Status: DC

## 2021-05-26 NOTE — Telephone Encounter (Signed)
Patient verbalized that she is doing fine and denies symptoms. Patient is aware of when Dr. Karilyn Cota will return.

## 2021-05-26 NOTE — Telephone Encounter (Signed)
Patient called and stated that she needs a refill of the following medications sent to [facility]:  ARMOUR THYROID 15 MG tablet  Last filled 04/23/2021, # 30 with 0 refills  ARMOUR THYROID 90 MG tablet  Last filled 04/23/2021, # 30 with 0 refills  Last OV 04/15/2021  Next OV 06/15/2021

## 2021-05-26 NOTE — Telephone Encounter (Signed)
Called patient to let her know and she stated that she is due for labs and has an appointment on 06/15/2021 and she said the T3 was from January and he said to stay on the current dose she is taking of both Armour thyroid pills. Patient is out and is really concerned and does not want to wait.  Please advise.

## 2021-05-26 NOTE — Telephone Encounter (Signed)
Called patient and gave her the message. Patient would like for Dr. Karilyn Cota to order her labs so we can call her and have her come in for a lab appointment before her office appointment. Patient would also like to have her medications sent in if okay to take the current dosage.  Dr. Karilyn Cota please advise.

## 2021-05-26 NOTE — Telephone Encounter (Signed)
Okay I will refill her enough to get her through to next week when Dr. Karilyn Cota returns.  Please let her know that he returns on 06/01/2021.  I recommend that she request additional refill around that date for when he comes back.  She should have enough pills to last her until the end of next week from me.

## 2021-05-28 NOTE — Progress Notes (Signed)
Carelink Summary Report / Loop Recorder 

## 2021-05-31 ENCOUNTER — Ambulatory Visit (INDEPENDENT_AMBULATORY_CARE_PROVIDER_SITE_OTHER): Payer: Medicare PPO | Admitting: Internal Medicine

## 2021-06-07 ENCOUNTER — Telehealth (INDEPENDENT_AMBULATORY_CARE_PROVIDER_SITE_OTHER): Payer: Self-pay

## 2021-06-07 ENCOUNTER — Other Ambulatory Visit (INDEPENDENT_AMBULATORY_CARE_PROVIDER_SITE_OTHER): Payer: Self-pay | Admitting: Internal Medicine

## 2021-06-07 DIAGNOSIS — E039 Hypothyroidism, unspecified: Secondary | ICD-10-CM

## 2021-06-07 MED ORDER — ARMOUR THYROID 90 MG PO TABS: 90.0000 mg | ORAL_TABLET | Freq: Every day | ORAL | 1 refills | Status: DC

## 2021-06-07 MED ORDER — ARMOUR THYROID 15 MG PO TABS: 15.0000 mg | ORAL_TABLET | Freq: Every day | ORAL | 1 refills | Status: DC

## 2021-06-07 NOTE — Telephone Encounter (Signed)
Patient called and left a VM and also came into office today. Patient needs 8 more days of the following medication until she can come in for her appointment on 06/15/2021 and patient would also like to have her labs done before her visit if possible? We have had to reschedule her and this is why she is out of medication.  ARMOUR THYROID 15 MG tablet  Last filled 05/26/2021, # 10 with 0 refills  ARMOUR THYROID 90 MG tablet  Last filled 05/26/2021, # 10 with 0 refills

## 2021-06-14 ENCOUNTER — Ambulatory Visit (INDEPENDENT_AMBULATORY_CARE_PROVIDER_SITE_OTHER): Payer: Medicare PPO

## 2021-06-14 DIAGNOSIS — R55 Syncope and collapse: Secondary | ICD-10-CM

## 2021-06-15 ENCOUNTER — Ambulatory Visit (INDEPENDENT_AMBULATORY_CARE_PROVIDER_SITE_OTHER): Payer: Medicare PPO | Admitting: Internal Medicine

## 2021-06-15 ENCOUNTER — Other Ambulatory Visit (INDEPENDENT_AMBULATORY_CARE_PROVIDER_SITE_OTHER): Payer: Self-pay | Admitting: Internal Medicine

## 2021-06-15 LAB — CUP PACEART REMOTE DEVICE CHECK
Date Time Interrogation Session: 20220722141244
Implantable Pulse Generator Implant Date: 20210621

## 2021-07-06 NOTE — Progress Notes (Signed)
Carelink Summary Report / Loop Recorder 

## 2021-07-16 ENCOUNTER — Telehealth: Payer: Self-pay

## 2021-07-16 NOTE — Telephone Encounter (Addendum)
  ILR alert received for 1 symptom correlating to the pause event 7/27 @ 22:27. Duration 8 seconds, appears SR w/ undersensing noted. Sensing is have room for increasing.  Patient called and reports during this time she was laying in bed reading, leaned over to turn off the light and felt like she was "going to black out". States she has experienced this before, has been evaluated by neuro, had MRI's and has apt. Today with upper cervical speciality spine chiropractor.    Routing to Dr. Graciela Husbands for review and recommendations.

## 2021-07-19 ENCOUNTER — Ambulatory Visit (INDEPENDENT_AMBULATORY_CARE_PROVIDER_SITE_OTHER): Payer: Medicare PPO

## 2021-07-19 DIAGNOSIS — R55 Syncope and collapse: Secondary | ICD-10-CM

## 2021-07-19 LAB — CUP PACEART REMOTE DEVICE CHECK
Date Time Interrogation Session: 20220825120257
Implantable Pulse Generator Implant Date: 20210621

## 2021-07-29 NOTE — Telephone Encounter (Signed)
Printed note, will review when Dr. Graciela Husbands is back in office.

## 2021-07-30 NOTE — Progress Notes (Signed)
Carelink Summary Report / Loop Recorder 

## 2021-08-03 ENCOUNTER — Ambulatory Visit: Payer: Medicare PPO | Admitting: Nurse Practitioner

## 2021-08-05 DIAGNOSIS — E8881 Metabolic syndrome: Secondary | ICD-10-CM | POA: Diagnosis not present

## 2021-08-05 DIAGNOSIS — R5382 Chronic fatigue, unspecified: Secondary | ICD-10-CM | POA: Diagnosis not present

## 2021-08-05 DIAGNOSIS — E569 Vitamin deficiency, unspecified: Secondary | ICD-10-CM | POA: Diagnosis not present

## 2021-08-05 DIAGNOSIS — E079 Disorder of thyroid, unspecified: Secondary | ICD-10-CM | POA: Diagnosis not present

## 2021-08-05 DIAGNOSIS — M359 Systemic involvement of connective tissue, unspecified: Secondary | ICD-10-CM | POA: Diagnosis not present

## 2021-08-05 DIAGNOSIS — R799 Abnormal finding of blood chemistry, unspecified: Secondary | ICD-10-CM | POA: Diagnosis not present

## 2021-08-05 DIAGNOSIS — Z139 Encounter for screening, unspecified: Secondary | ICD-10-CM | POA: Diagnosis not present

## 2021-08-05 DIAGNOSIS — E349 Endocrine disorder, unspecified: Secondary | ICD-10-CM | POA: Diagnosis not present

## 2021-08-23 ENCOUNTER — Ambulatory Visit (INDEPENDENT_AMBULATORY_CARE_PROVIDER_SITE_OTHER): Payer: Medicare PPO | Admitting: Family Medicine

## 2021-08-23 ENCOUNTER — Encounter: Payer: Self-pay | Admitting: Family Medicine

## 2021-08-23 ENCOUNTER — Ambulatory Visit (INDEPENDENT_AMBULATORY_CARE_PROVIDER_SITE_OTHER): Payer: Medicare PPO

## 2021-08-23 ENCOUNTER — Other Ambulatory Visit (HOSPITAL_COMMUNITY)
Admission: RE | Admit: 2021-08-23 | Discharge: 2021-08-23 | Disposition: A | Discharge status: Home / Self Care | Payer: Medicare PPO | Source: Ambulatory Visit | Attending: Family Medicine | Admitting: Family Medicine

## 2021-08-23 ENCOUNTER — Other Ambulatory Visit: Payer: Self-pay

## 2021-08-23 VITALS — BP 151/85 | HR 79 | Ht 65.0 in | Wt 133.7 lb

## 2021-08-23 DIAGNOSIS — R55 Syncope and collapse: Secondary | ICD-10-CM | POA: Diagnosis not present

## 2021-08-23 DIAGNOSIS — N941 Unspecified dyspareunia: Secondary | ICD-10-CM

## 2021-08-23 LAB — CUP PACEART REMOTE DEVICE CHECK
Date Time Interrogation Session: 20220926141529
Implantable Pulse Generator Implant Date: 20210621
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
## 2021-08-24 ENCOUNTER — Encounter: Payer: Self-pay | Admitting: Family Medicine

## 2021-08-24 LAB — CERVICOVAGINAL ANCILLARY ONLY
Bacterial Vaginitis (gardnerella): NEGATIVE
Candida Glabrata: NEGATIVE
Candida Vaginitis: NEGATIVE
Chlamydia: NEGATIVE
Comment: NEGATIVE
Comment: NEGATIVE
Comment: NEGATIVE
Comment: NEGATIVE
Comment: NEGATIVE
Comment: NORMAL
Neisseria Gonorrhea: NEGATIVE
Trichomonas: NEGATIVE

## 2021-08-26 ENCOUNTER — Encounter: Payer: Self-pay | Admitting: *Deleted

## 2021-08-26 ENCOUNTER — Ambulatory Visit (INDEPENDENT_AMBULATORY_CARE_PROVIDER_SITE_OTHER): Payer: Medicare PPO | Admitting: Physician Assistant

## 2021-08-26 ENCOUNTER — Other Ambulatory Visit: Payer: Self-pay

## 2021-08-26 ENCOUNTER — Encounter: Payer: Self-pay | Admitting: Physician Assistant

## 2021-08-26 VITALS — BP 124/80 | HR 100 | Ht 65.0 in | Wt 131.4 lb

## 2021-08-26 DIAGNOSIS — I447 Left bundle-branch block, unspecified: Secondary | ICD-10-CM

## 2021-08-26 LAB — CUP PACEART INCLINIC DEVICE CHECK
Date Time Interrogation Session: 20221006172902
Implantable Pulse Generator Implant Date: 20210621

## 2021-08-26 NOTE — Patient Instructions (Addendum)
Medication Instructions:   Your physician recommends that you continue on your current medications as directed. Please refer to the Current Medication list given to you today.  *If you need a refill on your cardiac medications before your next appointment, please call your pharmacy*   Lab Work:   RETURN FOR LABS BMET AND CBC ON SAME DAY AS ECHO  If you have labs (blood work) drawn today and your tests are completely normal, you will receive your results only by: MyChart Message (if you have MyChart) OR A paper copy in the mail If you have any lab test that is abnormal or we need to change your treatment, we will call you to review the results.   Testing/Procedures: ASAP BEFORE  09-27-21  Your physician has requested that you have an echocardiogram. Echocardiography is a painless test that uses sound waves to create images of your heart. It provides your doctor with information about the size and shape of your heart and how well your heart's chambers and valves are working. This procedure takes approximately one hour. There are no restrictions for this procedure.   Follow-Up: At [facility], you and your health needs are our priority.  As part of our continuing mission to provide you with exceptional heart care, we have created designated Provider Care Teams.  These Care Teams include your primary Cardiologist (physician) and Advanced Practice Providers (APPs -  Physician Assistants and Nurse Practitioners) who all work together to provide you with the care you need, when you need it.  We recommend signing up for the patient portal called "MyChart".  Sign up information is provided on this After Visit Summary.  MyChart is used to connect with patients for Virtual Visits (Telemedicine).  Patients are able to view lab/test results, encounter notes, upcoming appointments, etc.  Non-urgent messages can be sent to your provider as well.   To learn more about what you can do with MyChart, go to  [email or web address].    Your next appointment:  10-14 DAYS AFTER WOUND CHECK  09-27-21  3 month(s)  Phys Pacer Chk  The format for your next appointment:   In Person  Provider:   Steffanie Dunn, MD   Other Instructions
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
## 2021-08-30 NOTE — Progress Notes (Signed)
Carelink Summary Report / Loop Recorder 

## 2021-09-06 ENCOUNTER — Telehealth: Payer: Self-pay

## 2021-09-06 NOTE — Telephone Encounter (Signed)
Outreach made to Pt.  Pt will be rescheduled for pacemaker implant on November 28.  Will get lab work November 7

## 2021-09-07 ENCOUNTER — Telehealth: Payer: Self-pay

## 2021-09-07 NOTE — Telephone Encounter (Signed)
Spoke with pt and advised per Dr Lalla Brothers he is able to do pacemaker implant on Monday, September 13, 2021 as Francis Dowse, [place] feels pt should not wait until October 18, 2021.  Pt states she would prefer to wait as this will work out better for her to wait until after Thanksgiving.  Offered earlier appointment with Dr Graciela Husbands as well.  Pt states implant and lab work have already been scheduled and she does not want to change the date at this point.  Pt again advised per PA it is recommended that she not wait for PPM implant due to her intermittent heart block.  Pt advised will forward her request to leave implant date as is to Francis Dowse, PA-C and Dr Lalla Brothers.  Pt verbalizes understanding and thanked Charity fundraiser for the call.

## 2021-09-07 NOTE — Telephone Encounter (Signed)
-----   Message from [facility] [place], [place] sent at 09/07/2021  2:05 PM EDT ----- Regarding: RE: reschedule pacemaker That is not going to work.   She is a intermittent CHB patient of Dr. Odessa Fleming that CL was going to do for him because he was away for a couple weeks.    I don't think we should have her waiting 6 weeks for a pacer.  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  I have asked her not to drive until she has pacing support.    THANKS renee  ----- Message ----- From: Wiliam Ke, RN Sent: 09/07/2021   9:50 AM EDT To: Oleta Mouse, CMA, Sheilah Pigeon, PA-C Subject: RE: reschedule pacemaker                       I had tentatively moved her to November 28, but now I have a pacemaker system extraction.  Edson Snowball can you offer her December 5?  It is wide open, she could be first case if she wants. And lab work and echo on November 7 would not need to be rescheduled.  I have to move her there to schedule my extraction.    I will put her first case.  If it doesn't work for her please let me know.  Boneta Lucks  ----- Message ----- From: Sheilah Pigeon, PA-C Sent: 09/06/2021  10:47 AM EDT To: Oleta Mouse, CMA, Wiliam Ke, RN Subject: RE: reschedule pacemaker                        Edson Snowball, if you can please take care of this.  Thanks [name] ----- Message ----- From: Wiliam Ke, RN Sent: 09/06/2021   8:15 AM EDT To: Oleta Mouse, CMA, Sheilah Pigeon, PA-C Subject: reschedule pacemaker                            Pt was scheduled for pacemaker on Dr. Lovena Neighbours schedule for November 7.  He is NOT in the lab that day.  She will need to be rescheduled.  Thank you Boneta Lucks

## 2021-09-08 NOTE — Telephone Encounter (Signed)
Spoke with pt and advised PPM implant with Dr Lalla Brothers is having to be rescheduled to 10/25/2021.  Discussed again with pt the need to have implant sooner rather than later due to her symptoms.  Pt states again she needs to have procedure scheduled after the Thanksgiving holiday.  Offered pt 10/20/2021 with Dr Graciela Husbands.  Pt is agreeable to this date. Pt states she had an episode last night around midnight of SOB and feeling "shaky inside of her chest."  Spoke with device RN, Lanora Manis who states pt's monitor is not operational and therefore cannot check for alert.  Pt advised of this and she states she noticed there have been no lights on her monitor.  Requested pt check to see if monitor is plugged in.  Pt states she is at work until [employer] today but will check when she gets home.  Pt advised RN will not be in the office tomorrow  but will have device clinic contact her tomorrow morning to assist.  Pt verbalized understanding and agrees with current plan. Reviewed ED precautions again with pt and for now will plan for PPM implant on 10/20/2021 by Dr Graciela Husbands.  Device instructions sent to pt on MyChart.

## 2021-09-09 NOTE — Telephone Encounter (Signed)
Full transmission received 09/08/2021 at 6:59 pm.

## 2021-09-09 NOTE — Telephone Encounter (Signed)
Remote reviewed. No events logged during time of patient complaints. Patient called and updated.

## 2021-09-09 NOTE — Telephone Encounter (Signed)
Pt will follow with primary EP  No further action needed.

## 2021-09-13 ENCOUNTER — Other Ambulatory Visit: Payer: Medicare PPO

## 2021-09-13 ENCOUNTER — Other Ambulatory Visit (HOSPITAL_COMMUNITY): Payer: Medicare PPO

## 2021-09-15 ENCOUNTER — Telehealth: Payer: Self-pay

## 2021-09-15 NOTE — Telephone Encounter (Signed)
Patient called in stating she has been feeling dizzy and off balance and would like for someone to look at transmission and see if anything was seen.

## 2021-09-15 NOTE — Telephone Encounter (Signed)
Pt reports earlier today she had an episode where she was sitting in chair, she turned her head and felt as though she may pass out.  She denies LOC or current symptoms.  At time of episode she pressed symptom activator button.  She has not been home since episode.  When she gets home from work today she will request manual transmission from remote monitor.

## 2021-09-16 NOTE — Telephone Encounter (Signed)
Remote transmission reviewed. No new episodes noted and no abnormal rate/rhythm at symptom markers. Provided reassurance. Also discussed possible alternate cause of symptoms such as low blood sugar as patient states her symptoms slowly resolved after she ate. Patient will continue to monitor cardiac symptoms as well as note symptoms in correlation to last time eaten and possible symptomatic hypoglycemia. Patient appreciative of follow up.

## 2021-09-21 LAB — CUP PACEART REMOTE DEVICE CHECK
Date Time Interrogation Session: 20221026170033
Implantable Pulse Generator Implant Date: 20210621

## 2021-09-22 DIAGNOSIS — R55 Syncope and collapse: Secondary | ICD-10-CM | POA: Diagnosis not present

## 2021-09-22 DIAGNOSIS — J439 Emphysema, unspecified: Secondary | ICD-10-CM | POA: Diagnosis not present

## 2021-09-22 DIAGNOSIS — Z0189 Encounter for other specified special examinations: Secondary | ICD-10-CM | POA: Diagnosis not present

## 2021-09-22 DIAGNOSIS — E039 Hypothyroidism, unspecified: Secondary | ICD-10-CM | POA: Diagnosis not present

## 2021-09-22 DIAGNOSIS — K219 Gastro-esophageal reflux disease without esophagitis: Secondary | ICD-10-CM | POA: Diagnosis not present

## 2021-09-22 DIAGNOSIS — I4460 Unspecified fascicular block: Secondary | ICD-10-CM | POA: Diagnosis not present

## 2021-09-27 ENCOUNTER — Ambulatory Visit (INDEPENDENT_AMBULATORY_CARE_PROVIDER_SITE_OTHER): Payer: Medicare PPO

## 2021-09-27 ENCOUNTER — Other Ambulatory Visit: Payer: Medicare PPO | Admitting: *Deleted

## 2021-09-27 ENCOUNTER — Other Ambulatory Visit: Payer: Self-pay

## 2021-09-27 ENCOUNTER — Ambulatory Visit (HOSPITAL_COMMUNITY): Payer: Medicare PPO | Attending: Cardiology

## 2021-09-27 DIAGNOSIS — R55 Syncope and collapse: Secondary | ICD-10-CM

## 2021-09-27 DIAGNOSIS — I447 Left bundle-branch block, unspecified: Secondary | ICD-10-CM

## 2021-09-27 LAB — CBC
Hematocrit: 37.2 % (ref 34.0–46.6)
Hemoglobin: 13.1 g/dL (ref 11.1–15.9)
MCH: 32.4 pg (ref 26.6–33.0)
MCHC: 35.2 g/dL (ref 31.5–35.7)
MCV: 92 fL (ref 79–97)
Platelets: 252 10*3/uL (ref 150–450)
RBC: 4.04 x10E6/uL (ref 3.77–5.28)
RDW: 11.7 % (ref 11.7–15.4)
WBC: 5.8 10*3/uL (ref 3.4–10.8)

## 2021-09-27 LAB — ECHOCARDIOGRAM COMPLETE
Area-P 1/2: 3.65 cm2
S' Lateral: 4.2 cm

## 2021-09-27 LAB — BASIC METABOLIC PANEL WITH GFR
BUN/Creatinine Ratio: 14 (ref 12–28)
BUN: 11 mg/dL (ref 8–27)
CO2: 24 mmol/L (ref 20–29)
Calcium: 9.3 mg/dL (ref 8.7–10.3)
Chloride: 105 mmol/L (ref 96–106)
Creatinine, Ser: 0.77 mg/dL (ref 0.57–1.00)
Glucose: 87 mg/dL (ref 70–99)
Potassium: 4.4 mmol/L (ref 3.5–5.2)
Sodium: 140 mmol/L (ref 134–144)
eGFR: 84 mL/min/1.73

## 2021-09-30 ENCOUNTER — Telehealth: Payer: Self-pay

## 2021-09-30 NOTE — Progress Notes (Signed)
Carelink Summary Report / Loop Recorder 

## 2021-09-30 NOTE — Telephone Encounter (Signed)
Good Afternoon,  I tried to reach you by phone but was unable to leave a voicemail message due to your mailbox being full.  Francis Dowse has reviewed your echo results.  She states it looks much the same as your stress test in 2019 with heart muscle function mildly reduced.  She is going to forward to Dr Graciela Husbands for review as well.  If you have any questions please contact us at [phone].  Thank You,  Carlyle Basques

## 2021-09-30 NOTE — Telephone Encounter (Signed)
-----   Message from [facility], [place] sent at 09/30/2021 12:40 PM EST ----- Echo result,pending pacer.  I sent a staff message regarding this as well if you can review prior to pacer implant. FYI Pacer implant delayed at patient's preference....  renee

## 2021-10-07 ENCOUNTER — Ambulatory Visit: Payer: Medicare PPO

## 2021-10-11 ENCOUNTER — Telehealth: Payer: Self-pay

## 2021-10-11 NOTE — Telephone Encounter (Signed)
Spoke with pt and advised per Dr Graciela Husbands due to echo showing decreased heart muscle function this will change the type of pacemaker he implants.  Provided education on CRT and answered all questions pt had.  Pt verbalizes understanding and agrees with current plan.

## 2021-10-11 NOTE — Telephone Encounter (Signed)
-----   Message from [facility], [place] sent at 10/11/2021 10:47 AM EST ----- Mindi Junker Do you mind calling her?  I already changed the procedure on the schedule  Thanks  ----- Message ----- From: Duke Salvia, MD Sent: 10/11/2021   3:18 AM EST To: Jefferey Pica, RN, [name] Norberto Sorenson, PA-C, #  Please Inform Patient Echo showed mildly weakened heart muscle function -- this will impact the kind of pacemaker we put in

## 2021-10-19 ENCOUNTER — Telehealth: Payer: Self-pay

## 2021-10-19 NOTE — Telephone Encounter (Signed)
Spoke with pt and advised of need to reschedule device implant frim 10/20/2021 to 10/27/2021.  Pt advised arrival time and instructions are unchanged.  Pt verbalizes understanding and agrees with current plan.

## 2021-10-25 DIAGNOSIS — L814 Other melanin hyperpigmentation: Secondary | ICD-10-CM | POA: Diagnosis not present

## 2021-10-25 DIAGNOSIS — D485 Neoplasm of uncertain behavior of skin: Secondary | ICD-10-CM | POA: Diagnosis not present

## 2021-10-25 DIAGNOSIS — L821 Other seborrheic keratosis: Secondary | ICD-10-CM | POA: Diagnosis not present

## 2021-10-25 DIAGNOSIS — D225 Melanocytic nevi of trunk: Secondary | ICD-10-CM | POA: Diagnosis not present

## 2021-10-26 NOTE — Pre-Procedure Instructions (Signed)
Attempted to call patient.  No answer.

## 2021-10-27 ENCOUNTER — Ambulatory Visit (HOSPITAL_COMMUNITY)
Admission: RE | Admit: 2021-10-27 | Discharge: 2021-10-27 | Disposition: A | Discharge status: Home / Self Care | Payer: Medicare PPO | Attending: Internal Medicine | Admitting: Internal Medicine

## 2021-10-27 ENCOUNTER — Encounter (HOSPITAL_COMMUNITY)
Admission: RE | Disposition: A | Discharge status: Home / Self Care | Payer: Medicare PPO | Source: Home / Self Care | Attending: Internal Medicine

## 2021-10-27 ENCOUNTER — Ambulatory Visit (HOSPITAL_COMMUNITY): Payer: Medicare PPO

## 2021-10-27 DIAGNOSIS — Z959 Presence of cardiac and vascular implant and graft, unspecified: Secondary | ICD-10-CM

## 2021-10-27 DIAGNOSIS — J439 Emphysema, unspecified: Secondary | ICD-10-CM | POA: Diagnosis not present

## 2021-10-27 DIAGNOSIS — R55 Syncope and collapse: Secondary | ICD-10-CM | POA: Insufficient documentation

## 2021-10-27 DIAGNOSIS — I442 Atrioventricular block, complete: Secondary | ICD-10-CM | POA: Diagnosis not present

## 2021-10-27 HISTORY — PX: BIV PACEMAKER INSERTION CRT-P: EP1199

## 2021-10-27 HISTORY — PX: LOOP RECORDER REMOVAL: EP1215

## 2021-10-27 LAB — CUP PACEART REMOTE DEVICE CHECK
Date Time Interrogation Session: 20221201131547
Implantable Pulse Generator Implant Date: 20210621

## 2021-10-27 SURGERY — BIV PACEMAKER INSERTION CRT-P

## 2021-10-27 MED ORDER — LIDOCAINE HCL 1 % IJ SOLN
INTRAMUSCULAR | Status: AC
  Filled 2021-10-27: qty 60

## 2021-10-27 MED ORDER — IOHEXOL 350 MG/ML SOLN
INTRAVENOUS | Status: DC | PRN
  Administered 2021-10-27: 20 mL

## 2021-10-27 MED ORDER — SODIUM CHLORIDE 0.9 % IV SOLN: INTRAVENOUS | Status: DC

## 2021-10-27 MED ORDER — MIDAZOLAM HCL 5 MG/5ML IJ SOLN
INTRAMUSCULAR | Status: AC
  Filled 2021-10-27: qty 5

## 2021-10-27 MED ORDER — LIDOCAINE HCL (PF) 1 % IJ SOLN
INTRAMUSCULAR | Status: AC
  Filled 2021-10-27: qty 60

## 2021-10-27 MED ORDER — CHLORHEXIDINE GLUCONATE 4 % EX LIQD: 4.0000 "application " | Freq: Once | CUTANEOUS | Status: DC

## 2021-10-27 MED ORDER — ONDANSETRON HCL 4 MG/2ML IJ SOLN: 4.0000 mg | Freq: Four times a day (QID) | INTRAMUSCULAR | Status: DC | PRN

## 2021-10-27 MED ORDER — DIPHENHYDRAMINE HCL 50 MG/ML IJ SOLN
INTRAMUSCULAR | Status: AC
  Filled 2021-10-27: qty 1

## 2021-10-27 MED ORDER — VANCOMYCIN HCL IN DEXTROSE 1-5 GM/200ML-% IV SOLN
INTRAVENOUS | Status: AC
  Filled 2021-10-27: qty 200

## 2021-10-27 MED ORDER — MIDAZOLAM HCL 5 MG/5ML IJ SOLN
INTRAMUSCULAR | Status: DC | PRN
  Administered 2021-10-27: 2 mg via INTRAVENOUS
  Administered 2021-10-27 (×2): 1 mg via INTRAVENOUS

## 2021-10-27 MED ORDER — SODIUM CHLORIDE 0.9 % IV SOLN
INTRAVENOUS | Status: AC
  Filled 2021-10-27: qty 2

## 2021-10-27 MED ORDER — VANCOMYCIN HCL 1000 MG/200ML IV SOLN
1000.0000 mg | INTRAVENOUS | Status: AC
  Administered 2021-10-27: 1000 mg via INTRAVENOUS
  Filled 2021-10-27: qty 200

## 2021-10-27 MED ORDER — ACETAMINOPHEN 325 MG PO TABS
325.0000 mg | ORAL_TABLET | ORAL | Status: DC | PRN
  Filled 2021-10-27: qty 2

## 2021-10-27 MED ORDER — FENTANYL CITRATE (PF) 100 MCG/2ML IJ SOLN
INTRAMUSCULAR | Status: AC
  Filled 2021-10-27: qty 2

## 2021-10-27 MED ORDER — DIPHENHYDRAMINE HCL 50 MG/ML IJ SOLN
INTRAMUSCULAR | Status: DC | PRN
  Administered 2021-10-27 (×2): 25 mg via INTRAVENOUS

## 2021-10-27 MED ORDER — POVIDONE-IODINE 10 % EX SWAB
2.0000 "application " | Freq: Once | CUTANEOUS | Status: AC
  Administered 2021-10-27: 2 via TOPICAL

## 2021-10-27 MED ORDER — HEPARIN (PORCINE) IN NACL 1000-0.9 UT/500ML-% IV SOLN
INTRAVENOUS | Status: AC
  Filled 2021-10-27: qty 500

## 2021-10-27 MED ORDER — FENTANYL CITRATE (PF) 100 MCG/2ML IJ SOLN
INTRAMUSCULAR | Status: DC | PRN
  Administered 2021-10-27: 50 ug via INTRAVENOUS

## 2021-10-27 MED ORDER — LIDOCAINE HCL (PF) 1 % IJ SOLN
INTRAMUSCULAR | Status: DC | PRN
  Administered 2021-10-27: 50 mL
  Administered 2021-10-27: 10 mL

## 2021-10-27 MED ORDER — SODIUM CHLORIDE 0.9 % IV SOLN
80.0000 mg | INTRAVENOUS | Status: AC
  Administered 2021-10-27: 80 mg

## 2021-10-27 MED ORDER — HEPARIN (PORCINE) IN NACL 1000-0.9 UT/500ML-% IV SOLN
INTRAVENOUS | Status: DC | PRN
  Administered 2021-10-27: 500 mL

## 2021-10-27 SURGICAL SUPPLY — 14 items
CABLE SURGICAL S-101-97-12 (CABLE) ×6 IMPLANT
CATH RIGHTSITE C315HIS02 (CATHETERS) ×3 IMPLANT
HEMOSTAT SURGICEL 2X4 FIBR (HEMOSTASIS) ×3 IMPLANT
IPG PACE AZUR XT DR MRI W1DR01 (Pacemaker) ×2 IMPLANT
KIT MICROPUNCTURE NIT STIFF (SHEATH) ×3 IMPLANT
LEAD CAPSURE NOVUS 5076-58CM (Lead) ×3 IMPLANT
LEAD SELECT SECURE 3830 383069 (Lead) ×2 IMPLANT
PACE AZURE XT DR MRI W1DR01 (Pacemaker) ×3 IMPLANT
PAD DEFIB RADIO PHYSIO CONN (PAD) ×3 IMPLANT
SELECT SECURE 3830 383069 (Lead) ×3 IMPLANT
SHEATH 7FR PRELUDE SNAP 13 (SHEATH) ×6 IMPLANT
SHEATH 9FR PRELUDE SNAP 13 (SHEATH) ×3 IMPLANT
TRAY PACEMAKER INSERTION (PACKS) ×3 IMPLANT
WIRE HI TORQ VERSACORE-J 145CM (WIRE) ×3 IMPLANT
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
## 2021-10-27 NOTE — Progress Notes (Signed)
Report given to Albertina Parr who will assume care at this time

## 2021-10-27 NOTE — Discharge Instructions (Signed)
After Your Pacemaker   You have a Medtronic Pacemaker  ACTIVITY Do not lift your arm above shoulder height for 1 week after your procedure. After 7 days, you may progress as below.  You should remove your sling 24 hours after your procedure, unless otherwise instructed by your provider.     Wednesday November 03, 2021  Thursday November 04, 2021 Friday November 05, 2021 Saturday November 06, 2021   Do not lift, push, pull, or carry anything over 10 pounds with the affected arm until 6 weeks (Wednesday December 08, 2021 ) after your procedure.   You may drive AFTER your wound check, unless you have been told otherwise by your provider.   Ask your healthcare provider when you can go back to work   INCISION/Dressing If you are on a blood thinner such as Coumadin, Xarelto, Eliquis, Plavix, or Pradaxa please confirm with your provider when this should be resumed.  If large square, outer bandage is left in place, this can be removed after 24 hours from your procedure. Do not remove steri-strips or glue as below.   Monitor your Pacemaker site for redness, swelling, and drainage. Call the device clinic at [phone] if you experience these symptoms or fever/chills.  If your incision is closed with Dermabond/Surgical glue. You may shower 1 day after your pacemaker implant and wash around the site with soap and water.    If you were discharged in a sling, please do not wear this during the day more than 48 hours after your surgery unless otherwise instructed. This may increase the risk of stiffness and soreness in your shoulder.   Avoid lotions, ointments, or perfumes over your incision until it is well-healed.  You may use a hot tub or a pool AFTER your wound check appointment if the incision is completely closed.  PAcemaker Alerts:  Some alerts are vibratory and others beep. These are NOT emergencies. Please call our office to let us know. If this occurs at night or on weekends, it can  wait until the next business day. Send a remote transmission.  If your device is capable of reading fluid status (for heart failure), you will be offered monthly monitoring to review this with you.   DEVICE MANAGEMENT Remote monitoring is used to monitor your pacemaker from home. This monitoring is scheduled every 91 days by our office. It allows [place] to keep an eye on the functioning of your device to ensure it is working properly. You will routinely see your Electrophysiologist annually (more often if necessary).   You should receive your ID card for your new device in 4-8 weeks. Keep this card with you at all times once received. Consider wearing a medical alert bracelet or necklace.  Your Pacemaker may be MRI compatible. This will be discussed at your next office visit/wound check.  You should avoid contact with strong electric or magnetic fields.   Do not use amateur (ham) radio equipment or electric (arc) welding torches. MP3 player headphones with magnets should not be used. Some devices are safe to use if held at least 12 inches (30 cm) from your Pacemaker. These include power tools, lawn mowers, and speakers. If you are unsure if something is safe to use, ask your health care provider.  When using your cell phone, hold it to the ear that is on the opposite side from the Pacemaker. Do not leave your cell phone in a pocket over the Pacemaker.  You may safely use electric blankets, heating pads,  computers, and microwave ovens.  Call the office right away if: You have chest pain. You feel more short of breath than you have felt before. You feel more light-headed than you have felt before. Your incision starts to open up.  This information is not intended to replace advice given to you by your health care provider. Make sure you discuss any questions you have with your health care provider.

## 2021-10-27 NOTE — CV Procedure (Signed)
Kathleen Reed [id number]  [id number]  Preop [id number] implanted loop recorder syncope with demonstrated complete heart block Postop Dx same/  Procedure:removal The area was locally anesthesized and incision made.  The device was removed.  During the removal, an artery started bleeding which was identified superficially in the incision and cauterized and controlled.  The incision had been lengthened for visibility The incision was then closed with 4-0 vicryl and dermabond  Cx: bleeding as above     Sherryl Manges, MD 10/27/2021 12:49 PM

## 2021-10-28 ENCOUNTER — Encounter (HOSPITAL_COMMUNITY): Payer: Self-pay | Admitting: Internal Medicine

## 2021-10-29 ENCOUNTER — Telehealth: Payer: Self-pay

## 2021-10-29 NOTE — Telephone Encounter (Signed)
Follow-up after same day discharge: Implant date: 10/27/21 MD: Sherryl Manges, MD Device: CRT-D Location: left chest   Wound check visit: 11/03/21 at 9:20 90 day MD follow-up: 01/18/22 at 3:00  Remote Transmission received:yes  Dressing removed: patient has dermabond intact.    Successful telephone encounter to patient to follow up post device implant. Patient states she is doing well. Minimal soreness. No swelling or bleeding at site. Patient encouraged to utilize tylenol and/or ice pack for discomfort. Appointments confirmed. Device clinic contact provided for additional questions or concerns. Patient appreciative of call.

## 2021-10-29 NOTE — Telephone Encounter (Signed)
-----   Message from Sheilah Pigeon, [place] sent at 10/29/2021  6:11 AM EST ----- She was a same day d/c Wed  MDT CRT-P SK

## 2021-11-03 ENCOUNTER — Ambulatory Visit (INDEPENDENT_AMBULATORY_CARE_PROVIDER_SITE_OTHER): Payer: Medicare PPO

## 2021-11-03 ENCOUNTER — Other Ambulatory Visit: Payer: Self-pay

## 2021-11-03 DIAGNOSIS — I442 Atrioventricular block, complete: Secondary | ICD-10-CM

## 2021-11-03 LAB — CUP PACEART INCLINIC DEVICE CHECK
Battery Remaining Longevity: 184 mo
Battery Voltage: 3.22 V
Brady Statistic AP VP Percent: 0.01 %
Brady Statistic AP VS Percent: 1.12 %
Brady Statistic AS VP Percent: 0.03 %
Brady Statistic AS VS Percent: 98.84 %
Brady Statistic RA Percent Paced: 1.17 %
Brady Statistic RV Percent Paced: 0.04 %
Date Time Interrogation Session: 20221214162203
Implantable Lead Implant Date: 20221207
Implantable Lead Implant Date: 20221207
Implantable Lead Location: 753859
Implantable Lead Location: 753860
Implantable Lead Model: 3830
Implantable Lead Model: 5076
Implantable Pulse Generator Implant Date: 20221207
Lead Channel Impedance Value: 342 Ohm
Lead Channel Impedance Value: 380 Ohm
Lead Channel Impedance Value: 437 Ohm
Lead Channel Impedance Value: 513 Ohm
Lead Channel Pacing Threshold Amplitude: 0.625 V
Lead Channel Pacing Threshold Amplitude: 0.875 V
Lead Channel Pacing Threshold Pulse Width: 0.4 ms
Lead Channel Pacing Threshold Pulse Width: 0.4 ms
Lead Channel Sensing Intrinsic Amplitude: 1.875 mV
Lead Channel Sensing Intrinsic Amplitude: 5.125 mV
Lead Channel Setting Pacing Amplitude: 3.5 V
Lead Channel Setting Pacing Amplitude: 3.5 V
Lead Channel Setting Pacing Pulse Width: 0.4 ms
Lead Channel Setting Sensing Sensitivity: 0.9 mV

## 2021-11-03 MED ORDER — SULFAMETHOXAZOLE-TRIMETHOPRIM 800-160 MG PO TABS: 1.0000 | ORAL_TABLET | Freq: Two times a day (BID) | ORAL | 0 refills | Status: AC

## 2021-11-03 NOTE — Patient Instructions (Addendum)
° °  After Your Pacemaker   Monitor your pacemaker site for redness, swelling, and drainage. Call the device clinic at [phone] if you experience these symptoms or fever/chills.  Your incision was closed with Dermabond:  You may shower 1 day after your defibrillator implant and wash your incision with soap and water. Avoid lotions, ointments, or perfumes over your incision until it is well-healed.  You may use a hot tub or a pool after your wound check appointment if the incision is completely closed.  Do not lift, push or pull greater than 10 pounds with the affected arm until 6 weeks after your procedure. There are no other restrictions in arm movement after your wound check appointment.  You may drive, unless driving has been restricted by your healthcare providers.  Remote monitoring is used to monitor your pacemaker from home. This monitoring is scheduled every 91 days by our office. It allows [place] to keep an eye on the functioning of your device to ensure it is working properly. You will routinely see your Electrophysiologist annually (more often if necessary).  Please continue to change dressing at incision to left breast several times a day and washing with clean wash cloth between dressing changes.

## 2021-11-03 NOTE — Progress Notes (Signed)
Wound check appointment. Steri-strips removed. Wound without redness or edema. Incision edges approximated, wound well healed. Normal device function. Thresholds, sensing, and impedances consistent with implant measurements. Device programmed at 3.5V/auto capture programmed on for extra safety margin until 3 month visit. Histogram distribution appropriate for patient and level of activity. No mode switches or high ventricular rates noted. Patient educated about wound care, arm mobility, lifting restrictions. ROV in 3 months with implanting physician.  ILR explant site assessed with Dr. Graciela Husbands and noted exudate tissue present with questionable drainage. Brushing noted around site. Verbal ordered obtained from Dr. Graciela Husbands to send to pharmacy Bactrim DS 800-160 mg 1 tablet BID x5 days. Order sent to pharmacy. Also orders obtained for wet to dry dressing changes for several days.Wet to dry dressing put in place today, patient and family provided education.    Patient aware to call if any increased redness, swelling, drainage, fever or chills. Verbalized understanding.

## 2021-11-20 ENCOUNTER — Encounter: Payer: Self-pay | Admitting: Internal Medicine

## 2021-11-23 ENCOUNTER — Telehealth: Payer: Self-pay

## 2021-11-23 NOTE — Telephone Encounter (Signed)
Addressed with patient via mychart as she also sent message.

## 2021-11-23 NOTE — Telephone Encounter (Signed)
Patient called in concerned about her ppm site, she states that it is uncomfortable and wants to know is it normal for it to still feel this way. Patient was implanted 10/27/2021

## 2021-11-30 DIAGNOSIS — E039 Hypothyroidism, unspecified: Secondary | ICD-10-CM | POA: Diagnosis not present

## 2021-11-30 DIAGNOSIS — R799 Abnormal finding of blood chemistry, unspecified: Secondary | ICD-10-CM | POA: Diagnosis not present

## 2021-11-30 DIAGNOSIS — N951 Menopausal and female climacteric states: Secondary | ICD-10-CM | POA: Diagnosis not present

## 2021-11-30 DIAGNOSIS — E8881 Metabolic syndrome: Secondary | ICD-10-CM | POA: Diagnosis not present

## 2021-12-27 DIAGNOSIS — E039 Hypothyroidism, unspecified: Secondary | ICD-10-CM | POA: Diagnosis not present

## 2021-12-30 DIAGNOSIS — I4460 Unspecified fascicular block: Secondary | ICD-10-CM | POA: Diagnosis not present

## 2021-12-30 DIAGNOSIS — Z950 Presence of cardiac pacemaker: Secondary | ICD-10-CM | POA: Diagnosis not present

## 2021-12-30 DIAGNOSIS — E039 Hypothyroidism, unspecified: Secondary | ICD-10-CM | POA: Diagnosis not present

## 2021-12-30 DIAGNOSIS — R55 Syncope and collapse: Secondary | ICD-10-CM | POA: Diagnosis not present

## 2021-12-30 DIAGNOSIS — E782 Mixed hyperlipidemia: Secondary | ICD-10-CM | POA: Diagnosis not present

## 2021-12-30 DIAGNOSIS — I442 Atrioventricular block, complete: Secondary | ICD-10-CM | POA: Diagnosis not present

## 2021-12-30 DIAGNOSIS — K219 Gastro-esophageal reflux disease without esophagitis: Secondary | ICD-10-CM | POA: Diagnosis not present

## 2021-12-30 DIAGNOSIS — J439 Emphysema, unspecified: Secondary | ICD-10-CM | POA: Diagnosis not present

## 2022-01-11 ENCOUNTER — Other Ambulatory Visit: Payer: Self-pay | Admitting: Physician Assistant

## 2022-01-11 ENCOUNTER — Other Ambulatory Visit: Payer: Self-pay | Admitting: Family Medicine

## 2022-01-11 DIAGNOSIS — K824 Cholesterolosis of gallbladder: Secondary | ICD-10-CM

## 2022-01-17 ENCOUNTER — Encounter: Payer: Medicare PPO | Admitting: Cardiology

## 2022-01-17 DIAGNOSIS — I442 Atrioventricular block, complete: Secondary | ICD-10-CM | POA: Insufficient documentation

## 2022-01-17 DIAGNOSIS — Z950 Presence of cardiac pacemaker: Secondary | ICD-10-CM | POA: Insufficient documentation

## 2022-01-18 ENCOUNTER — Encounter: Payer: Self-pay | Admitting: Internal Medicine

## 2022-01-18 ENCOUNTER — Other Ambulatory Visit: Payer: Self-pay

## 2022-01-18 ENCOUNTER — Ambulatory Visit: Payer: Medicare PPO | Admitting: Internal Medicine

## 2022-01-18 VITALS — BP 126/84 | HR 77 | Ht 65.0 in | Wt 133.4 lb

## 2022-01-18 DIAGNOSIS — Z950 Presence of cardiac pacemaker: Secondary | ICD-10-CM | POA: Diagnosis not present

## 2022-01-18 DIAGNOSIS — I447 Left bundle-branch block, unspecified: Secondary | ICD-10-CM

## 2022-01-18 DIAGNOSIS — I442 Atrioventricular block, complete: Secondary | ICD-10-CM
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
## 2022-01-18 NOTE — Patient Instructions (Signed)
Medication Instructions:  Your physician recommends that you continue on your current medications as directed. Please refer to the Current Medication list given to you today.  *If you need a refill on your cardiac medications before your next appointment, please call your pharmacy*   Lab Work: None ordered.  If you have labs (blood work) drawn today and your tests are completely normal, you will receive your results only by: MyChart Message (if you have MyChart) OR A paper copy in the mail If you have any lab test that is abnormal or we need to change your treatment, we will call you to review the results.   Testing/Procedures: None ordered.    Follow-Up: At [facility], you and your health needs are our priority.  As part of our continuing mission to provide you with exceptional heart care, we have created designated Provider Care Teams.  These Care Teams include your primary Cardiologist (physician) and Advanced Practice Providers (APPs -  Physician Assistants and Nurse Practitioners) who all work together to provide you with the care you need, when you need it.  We recommend signing up for the patient portal called "MyChart".  Sign up information is provided on this After Visit Summary.  MyChart is used to connect with patients for Virtual Visits (Telemedicine).  Patients are able to view lab/test results, encounter notes, upcoming appointments, etc.  Non-urgent messages can be sent to your provider as well.   To learn more about what you can do with MyChart, go to [email or web address].    Your next appointment:   12 months with Dr Graciela Husbands or his PA

## 2022-01-19 ENCOUNTER — Other Ambulatory Visit: Payer: Medicare PPO

## 2022-01-19 NOTE — Addendum Note (Signed)
Addended by: Janan Halter F on: 01/19/2022 10:36 AM   Modules accepted: Orders

## 2022-01-26 LAB — CUP PACEART REMOTE DEVICE CHECK
Battery Remaining Longevity: 182 mo
Battery Voltage: 3.21 V
Brady Statistic AP VP Percent: 0.01 %
Brady Statistic AP VS Percent: 3.68 %
Brady Statistic AS VP Percent: 0.02 %
Brady Statistic AS VS Percent: 96.3 %
Brady Statistic RA Percent Paced: 3.73 %
Brady Statistic RV Percent Paced: 0.03 %
Date Time Interrogation Session: 20230305222552
Implantable Lead Implant Date: 20221207
Implantable Lead Implant Date: 20221207
Implantable Lead Location: 753859
Implantable Lead Location: 753860
Implantable Lead Model: 3830
Implantable Lead Model: 5076
Implantable Pulse Generator Implant Date: 20221207
Lead Channel Impedance Value: 361 Ohm
Lead Channel Impedance Value: 380 Ohm
Lead Channel Impedance Value: 456 Ohm
Lead Channel Impedance Value: 513 Ohm
Lead Channel Pacing Threshold Amplitude: 0.375 V
Lead Channel Pacing Threshold Amplitude: 0.875 V
Lead Channel Pacing Threshold Pulse Width: 0.4 ms
Lead Channel Pacing Threshold Pulse Width: 0.4 ms
Lead Channel Sensing Intrinsic Amplitude: 2.625 mV
Lead Channel Sensing Intrinsic Amplitude: 2.625 mV
Lead Channel Sensing Intrinsic Amplitude: 8 mV
Lead Channel Sensing Intrinsic Amplitude: 8 mV
Lead Channel Setting Pacing Amplitude: 2.5 V
Lead Channel Setting Pacing Amplitude: 3.5 V
Lead Channel Setting Pacing Pulse Width: 0.4 ms
Lead Channel Setting Sensing Sensitivity: 0.9 mV

## 2022-01-27 ENCOUNTER — Ambulatory Visit (INDEPENDENT_AMBULATORY_CARE_PROVIDER_SITE_OTHER): Payer: Medicare PPO

## 2022-01-27 DIAGNOSIS — I442 Atrioventricular block, complete: Secondary | ICD-10-CM | POA: Diagnosis not present

## 2022-02-09 NOTE — Progress Notes (Signed)
Remote pacemaker transmission.   

## 2022-02-28 DIAGNOSIS — I87323 Chronic venous hypertension (idiopathic) with inflammation of bilateral lower extremity: Secondary | ICD-10-CM | POA: Diagnosis not present

## 2022-02-28 DIAGNOSIS — I872 Venous insufficiency (chronic) (peripheral): Secondary | ICD-10-CM | POA: Diagnosis not present

## 2022-03-28 ENCOUNTER — Ambulatory Visit
Admission: RE | Admit: 2022-03-28 | Discharge: 2022-03-28 | Disposition: A | Discharge status: Home / Self Care | Payer: Medicare PPO | Source: Ambulatory Visit | Attending: Physician Assistant | Admitting: Physician Assistant

## 2022-03-28 DIAGNOSIS — K824 Cholesterolosis of gallbladder: Secondary | ICD-10-CM

## 2022-04-13 ENCOUNTER — Encounter: Payer: Self-pay | Admitting: Internal Medicine

## 2022-04-13 ENCOUNTER — Telehealth: Payer: Self-pay | Admitting: Internal Medicine

## 2022-04-13 NOTE — Telephone Encounter (Signed)
Received a message via pt schedule stating the patient is requesting an appointment due to CP. Sent the patient the dot phrase questions and she responded stating:  "My cp is intermittent with mild sob no other symptoms.  It started after I slept several hours on my pacemaker side on Thursday night, so it could be muscular pain.   I guess I am more afraid of damage to the pacemaker or leads than of heart attacks"  Please advise.

## 2022-04-13 NOTE — Telephone Encounter (Signed)
Error

## 2022-04-13 NOTE — Telephone Encounter (Signed)
Lm to call back ./cy 

## 2022-04-13 NOTE — Telephone Encounter (Signed)
Spoke with pt who reports she had an episode of CP a "couple of night ago" with "twinges in her arms and some SOB.  Denies current CP, SOB or dizziness.  She states she had no nausea, vomiting or dizziness at the time of the CP. Requested pt send a remote transmission for review.  Pt states she will need assistance with this as she is not currently at home.  Pt is due for an appointment with Dr Antoine Poche.  Appointment scheduled for 05/02/2022 at 10am.  Will forward message to device clinic to assist pt with sending transmission.  Reviewed ED precautions.  Pt verbalizes understanding and agrees with current plan.

## 2022-04-14 NOTE — Telephone Encounter (Signed)
Spoke with patient she stated that she could not send a manual transmission as she was not in a spot with good cell service, advised patient to check her MyChart message there is a number there to call on help with send a manual transmission, advised patient that if transmission is received after 5pm it would be tomorrow before I reviewed it, patient stated she would send before 5pm

## 2022-04-15 NOTE — Telephone Encounter (Signed)
Spoke with patient informed her that I had reviewed her transmission and that her pacemaker was functioning normally patient appreciative of call back

## 2022-04-26 DIAGNOSIS — R5382 Chronic fatigue, unspecified: Secondary | ICD-10-CM | POA: Diagnosis not present

## 2022-04-26 DIAGNOSIS — R799 Abnormal finding of blood chemistry, unspecified: Secondary | ICD-10-CM | POA: Diagnosis not present

## 2022-04-26 DIAGNOSIS — Z780 Asymptomatic menopausal state: Secondary | ICD-10-CM | POA: Diagnosis not present

## 2022-04-26 DIAGNOSIS — E039 Hypothyroidism, unspecified: Secondary | ICD-10-CM | POA: Diagnosis not present

## 2022-04-28 ENCOUNTER — Ambulatory Visit (INDEPENDENT_AMBULATORY_CARE_PROVIDER_SITE_OTHER): Payer: Medicare PPO

## 2022-04-28 DIAGNOSIS — I442 Atrioventricular block, complete: Secondary | ICD-10-CM | POA: Diagnosis not present

## 2022-05-01 DIAGNOSIS — R072 Precordial pain: Secondary | ICD-10-CM | POA: Insufficient documentation

## 2022-05-01 LAB — CUP PACEART REMOTE DEVICE CHECK
Battery Remaining Longevity: 178 mo
Battery Voltage: 3.19 V
Brady Statistic AP VP Percent: 0.01 %
Brady Statistic AP VS Percent: 5.44 %
Brady Statistic AS VP Percent: 0.04 %
Brady Statistic AS VS Percent: 94.51 %
Brady Statistic RA Percent Paced: 5.5 %
Brady Statistic RV Percent Paced: 0.05 %
Date Time Interrogation Session: 20230609160836
Implantable Lead Implant Date: 20221207
Implantable Lead Implant Date: 20221207
Implantable Lead Location: 753859
Implantable Lead Location: 753860
Implantable Lead Model: 3830
Implantable Lead Model: 5076
Implantable Pulse Generator Implant Date: 20221207
Lead Channel Impedance Value: 361 Ohm
Lead Channel Impedance Value: 361 Ohm
Lead Channel Impedance Value: 437 Ohm
Lead Channel Impedance Value: 494 Ohm
Lead Channel Pacing Threshold Amplitude: 0.375 V
Lead Channel Pacing Threshold Amplitude: 0.75 V
Lead Channel Pacing Threshold Pulse Width: 0.4 ms
Lead Channel Pacing Threshold Pulse Width: 0.4 ms
Lead Channel Sensing Intrinsic Amplitude: 1 mV
Lead Channel Sensing Intrinsic Amplitude: 1 mV
Lead Channel Sensing Intrinsic Amplitude: 8.375 mV
Lead Channel Sensing Intrinsic Amplitude: 8.375 mV
Lead Channel Setting Pacing Amplitude: 1.5 V
Lead Channel Setting Pacing Amplitude: 2 V
Lead Channel Setting Pacing Pulse Width: 0.4 ms
Lead Channel Setting Sensing Sensitivity: 0.9 mV
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
## 2022-05-02 ENCOUNTER — Ambulatory Visit (INDEPENDENT_AMBULATORY_CARE_PROVIDER_SITE_OTHER): Payer: Medicare PPO | Admitting: Cardiology

## 2022-05-02 ENCOUNTER — Encounter: Payer: Self-pay | Admitting: Cardiology

## 2022-05-02 VITALS — BP 122/89 | HR 63 | Ht 65.0 in | Wt 134.8 lb

## 2022-05-02 DIAGNOSIS — R072 Precordial pain: Secondary | ICD-10-CM

## 2022-05-02 DIAGNOSIS — I6523 Occlusion and stenosis of bilateral carotid arteries: Secondary | ICD-10-CM

## 2022-05-02 DIAGNOSIS — Z950 Presence of cardiac pacemaker: Secondary | ICD-10-CM | POA: Diagnosis not present

## 2022-05-02 DIAGNOSIS — E785 Hyperlipidemia, unspecified: Secondary | ICD-10-CM

## 2022-05-02 NOTE — Patient Instructions (Signed)
Medication Instructions:  Your physician recommends that you continue on your current medications as directed. Please refer to the Current Medication list given to you today.  *If you need a refill on your cardiac medications before your next appointment, please call your pharmacy*   Follow-Up: At [facility], you and your health needs are our priority.  As part of our continuing mission to provide you with exceptional heart care, we have created designated Provider Care Teams.  These Care Teams include your primary Cardiologist (physician) and Advanced Practice Providers (APPs -  Physician Assistants and Nurse Practitioners) who all work together to provide you with the care you need, when you need it.  We recommend signing up for the patient portal called "MyChart".  Sign up information is provided on this After Visit Summary.  MyChart is used to connect with patients for Virtual Visits (Telemedicine).  Patients are able to view lab/test results, encounter notes, upcoming appointments, etc.  Non-urgent messages can be sent to your provider as well.   To learn more about what you can do with MyChart, go to [email or web address].    Your next appointment:   We will see you on an as needed basis.  Provider:   James Hochrein, MD 

## 2022-05-06 NOTE — Progress Notes (Signed)
Remote pacemaker transmission.   

## 2022-06-07 ENCOUNTER — Other Ambulatory Visit: Payer: Self-pay | Admitting: Internal Medicine

## 2022-06-07 DIAGNOSIS — Z1231 Encounter for screening mammogram for malignant neoplasm of breast: Secondary | ICD-10-CM

## 2022-06-10 ENCOUNTER — Ambulatory Visit
Admission: RE | Admit: 2022-06-10 | Discharge: 2022-06-10 | Disposition: A | Discharge status: Home / Self Care | Payer: Medicare PPO | Source: Ambulatory Visit | Attending: Internal Medicine | Admitting: Internal Medicine

## 2022-06-10 DIAGNOSIS — Z1231 Encounter for screening mammogram for malignant neoplasm of breast: Secondary | ICD-10-CM | POA: Diagnosis not present

## 2022-07-01 ENCOUNTER — Other Ambulatory Visit (HOSPITAL_COMMUNITY): Payer: Self-pay | Admitting: Internal Medicine

## 2022-07-01 DIAGNOSIS — K219 Gastro-esophageal reflux disease without esophagitis: Secondary | ICD-10-CM | POA: Diagnosis not present

## 2022-07-01 DIAGNOSIS — Z0001 Encounter for general adult medical examination with abnormal findings: Secondary | ICD-10-CM | POA: Diagnosis not present

## 2022-07-01 DIAGNOSIS — Z13820 Encounter for screening for osteoporosis: Secondary | ICD-10-CM

## 2022-07-01 DIAGNOSIS — E782 Mixed hyperlipidemia: Secondary | ICD-10-CM | POA: Diagnosis not present

## 2022-07-01 DIAGNOSIS — E039 Hypothyroidism, unspecified: Secondary | ICD-10-CM | POA: Diagnosis not present

## 2022-07-01 DIAGNOSIS — I4460 Unspecified fascicular block: Secondary | ICD-10-CM | POA: Diagnosis not present

## 2022-07-01 DIAGNOSIS — J439 Emphysema, unspecified: Secondary | ICD-10-CM | POA: Diagnosis not present

## 2022-07-01 DIAGNOSIS — R55 Syncope and collapse: Secondary | ICD-10-CM | POA: Diagnosis not present

## 2022-07-01 DIAGNOSIS — I442 Atrioventricular block, complete: Secondary | ICD-10-CM | POA: Diagnosis not present

## 2022-07-01 DIAGNOSIS — Z950 Presence of cardiac pacemaker: Secondary | ICD-10-CM | POA: Diagnosis not present

## 2022-07-13 ENCOUNTER — Ambulatory Visit (HOSPITAL_COMMUNITY)
Admission: RE | Admit: 2022-07-13 | Discharge: 2022-07-13 | Disposition: A | Discharge status: Home / Self Care | Payer: Medicare PPO | Source: Ambulatory Visit | Attending: Internal Medicine | Admitting: Internal Medicine

## 2022-07-13 DIAGNOSIS — Z780 Asymptomatic menopausal state: Secondary | ICD-10-CM | POA: Insufficient documentation

## 2022-07-13 DIAGNOSIS — Z13820 Encounter for screening for osteoporosis: Secondary | ICD-10-CM | POA: Insufficient documentation

## 2022-07-28 ENCOUNTER — Ambulatory Visit (INDEPENDENT_AMBULATORY_CARE_PROVIDER_SITE_OTHER): Payer: Medicare PPO

## 2022-07-28 DIAGNOSIS — I447 Left bundle-branch block, unspecified: Secondary | ICD-10-CM

## 2022-07-28 LAB — CUP PACEART REMOTE DEVICE CHECK
Battery Remaining Longevity: 175 mo
Battery Voltage: 3.15 V
Brady Statistic AP VP Percent: 0.04 %
Brady Statistic AP VS Percent: 12.93 %
Brady Statistic AS VP Percent: 0.03 %
Brady Statistic AS VS Percent: 87.01 %
Brady Statistic RA Percent Paced: 12.99 %
Brady Statistic RV Percent Paced: 0.07 %
Date Time Interrogation Session: 20230907212243
Implantable Lead Implant Date: 20221207
Implantable Lead Implant Date: 20221207
Implantable Lead Location: 753859
Implantable Lead Location: 753860
Implantable Lead Model: 3830
Implantable Lead Model: 5076
Implantable Pulse Generator Implant Date: 20221207
Lead Channel Impedance Value: 342 Ohm
Lead Channel Impedance Value: 361 Ohm
Lead Channel Impedance Value: 494 Ohm
Lead Channel Impedance Value: 513 Ohm
Lead Channel Pacing Threshold Amplitude: 0.5 V
Lead Channel Pacing Threshold Amplitude: 0.75 V
Lead Channel Pacing Threshold Pulse Width: 0.4 ms
Lead Channel Pacing Threshold Pulse Width: 0.4 ms
Lead Channel Sensing Intrinsic Amplitude: 2.875 mV
Lead Channel Sensing Intrinsic Amplitude: 2.875 mV
Lead Channel Sensing Intrinsic Amplitude: 6.5 mV
Lead Channel Sensing Intrinsic Amplitude: 6.5 mV
Lead Channel Setting Pacing Amplitude: 1.5 V
Lead Channel Setting Pacing Amplitude: 2 V
Lead Channel Setting Pacing Pulse Width: 0.4 ms
Lead Channel Setting Sensing Sensitivity: 0.9 mV

## 2022-08-13 NOTE — Progress Notes (Signed)
Remote pacemaker transmission.   

## 2022-08-24 DIAGNOSIS — M546 Pain in thoracic spine: Secondary | ICD-10-CM | POA: Diagnosis not present

## 2022-08-24 DIAGNOSIS — M542 Cervicalgia: Secondary | ICD-10-CM | POA: Diagnosis not present

## 2022-09-28 DIAGNOSIS — L989 Disorder of the skin and subcutaneous tissue, unspecified: Secondary | ICD-10-CM | POA: Diagnosis not present

## 2022-09-28 DIAGNOSIS — D485 Neoplasm of uncertain behavior of skin: Secondary | ICD-10-CM | POA: Diagnosis not present

## 2022-09-28 DIAGNOSIS — L821 Other seborrheic keratosis: Secondary | ICD-10-CM | POA: Diagnosis not present

## 2022-10-03 DIAGNOSIS — D485 Neoplasm of uncertain behavior of skin: Secondary | ICD-10-CM | POA: Diagnosis not present

## 2022-10-18 DIAGNOSIS — I872 Venous insufficiency (chronic) (peripheral): Secondary | ICD-10-CM | POA: Diagnosis not present

## 2022-10-18 DIAGNOSIS — D0371 Melanoma in situ of right lower limb, including hip: Secondary | ICD-10-CM | POA: Diagnosis not present

## 2022-10-18 DIAGNOSIS — L989 Disorder of the skin and subcutaneous tissue, unspecified: Secondary | ICD-10-CM | POA: Diagnosis not present

## 2022-10-24 DIAGNOSIS — D0371 Melanoma in situ of right lower limb, including hip: Secondary | ICD-10-CM | POA: Diagnosis not present

## 2022-10-27 ENCOUNTER — Ambulatory Visit (INDEPENDENT_AMBULATORY_CARE_PROVIDER_SITE_OTHER): Payer: Medicare PPO

## 2022-10-27 DIAGNOSIS — I442 Atrioventricular block, complete: Secondary | ICD-10-CM

## 2022-10-27 LAB — CUP PACEART REMOTE DEVICE CHECK
Battery Remaining Longevity: 172 mo
Battery Voltage: 3.11 V
Brady Statistic AP VP Percent: 0.03 %
Brady Statistic AP VS Percent: 11.16 %
Brady Statistic AS VP Percent: 0.03 %
Brady Statistic AS VS Percent: 88.79 %
Brady Statistic RA Percent Paced: 11.21 %
Brady Statistic RV Percent Paced: 0.05 %
Date Time Interrogation Session: 20231206234243
Implantable Lead Connection Status: 753985
Implantable Lead Connection Status: 753985
Implantable Lead Implant Date: 20221207
Implantable Lead Implant Date: 20221207
Implantable Lead Location: 753859
Implantable Lead Location: 753860
Implantable Lead Model: 3830
Implantable Lead Model: 5076
Implantable Pulse Generator Implant Date: 20221207
Lead Channel Impedance Value: 323 Ohm
Lead Channel Impedance Value: 361 Ohm
Lead Channel Impedance Value: 494 Ohm
Lead Channel Impedance Value: 532 Ohm
Lead Channel Pacing Threshold Amplitude: 0.625 V
Lead Channel Pacing Threshold Amplitude: 0.875 V
Lead Channel Pacing Threshold Pulse Width: 0.4 ms
Lead Channel Pacing Threshold Pulse Width: 0.4 ms
Lead Channel Sensing Intrinsic Amplitude: 1 mV
Lead Channel Sensing Intrinsic Amplitude: 1 mV
Lead Channel Sensing Intrinsic Amplitude: 10.125 mV
Lead Channel Sensing Intrinsic Amplitude: 10.125 mV
Lead Channel Setting Pacing Amplitude: 1.5 V
Lead Channel Setting Pacing Amplitude: 2 V
Lead Channel Setting Pacing Pulse Width: 0.4 ms
Lead Channel Setting Sensing Sensitivity: 0.9 mV
Zone Setting Status: 755011
Zone Setting Status: 755011

## 2022-11-07 DIAGNOSIS — L905 Scar conditions and fibrosis of skin: Secondary | ICD-10-CM | POA: Diagnosis not present

## 2022-11-07 DIAGNOSIS — L814 Other melanin hyperpigmentation: Secondary | ICD-10-CM | POA: Diagnosis not present

## 2022-11-07 DIAGNOSIS — L821 Other seborrheic keratosis: Secondary | ICD-10-CM | POA: Diagnosis not present

## 2022-11-07 DIAGNOSIS — Z08 Encounter for follow-up examination after completed treatment for malignant neoplasm: Secondary | ICD-10-CM | POA: Diagnosis not present

## 2022-11-07 DIAGNOSIS — L84 Corns and callosities: Secondary | ICD-10-CM | POA: Diagnosis not present

## 2022-11-07 DIAGNOSIS — D225 Melanocytic nevi of trunk: Secondary | ICD-10-CM | POA: Diagnosis not present

## 2022-11-07 DIAGNOSIS — D485 Neoplasm of uncertain behavior of skin: Secondary | ICD-10-CM | POA: Diagnosis not present

## 2022-11-07 DIAGNOSIS — Z86006 Personal history of melanoma in-situ: Secondary | ICD-10-CM | POA: Diagnosis not present

## 2022-11-18 NOTE — Progress Notes (Signed)
Remote pacemaker transmission.   

## 2022-12-09 DIAGNOSIS — R5382 Chronic fatigue, unspecified: Secondary | ICD-10-CM | POA: Diagnosis not present

## 2022-12-09 DIAGNOSIS — E039 Hypothyroidism, unspecified: Secondary | ICD-10-CM | POA: Diagnosis not present

## 2022-12-09 DIAGNOSIS — R799 Abnormal finding of blood chemistry, unspecified: Secondary | ICD-10-CM | POA: Diagnosis not present

## 2023-01-11 DIAGNOSIS — K219 Gastro-esophageal reflux disease without esophagitis: Secondary | ICD-10-CM | POA: Diagnosis not present

## 2023-01-11 DIAGNOSIS — R55 Syncope and collapse: Secondary | ICD-10-CM | POA: Diagnosis not present

## 2023-01-11 DIAGNOSIS — M25511 Pain in right shoulder: Secondary | ICD-10-CM | POA: Diagnosis not present

## 2023-01-11 DIAGNOSIS — I4460 Unspecified fascicular block: Secondary | ICD-10-CM | POA: Diagnosis not present

## 2023-01-11 DIAGNOSIS — E039 Hypothyroidism, unspecified: Secondary | ICD-10-CM | POA: Diagnosis not present

## 2023-01-11 DIAGNOSIS — J439 Emphysema, unspecified: Secondary | ICD-10-CM | POA: Diagnosis not present

## 2023-01-11 DIAGNOSIS — L729 Follicular cyst of the skin and subcutaneous tissue, unspecified: Secondary | ICD-10-CM | POA: Diagnosis not present

## 2023-01-11 DIAGNOSIS — I442 Atrioventricular block, complete: Secondary | ICD-10-CM | POA: Diagnosis not present

## 2023-01-11 DIAGNOSIS — E782 Mixed hyperlipidemia: Secondary | ICD-10-CM | POA: Diagnosis not present

## 2023-01-26 ENCOUNTER — Ambulatory Visit: Payer: Medicare PPO

## 2023-01-26 DIAGNOSIS — I442 Atrioventricular block, complete: Secondary | ICD-10-CM | POA: Diagnosis not present

## 2023-01-26 LAB — CUP PACEART REMOTE DEVICE CHECK
Battery Remaining Longevity: 169 mo
Battery Voltage: 3.06 V
Brady Statistic AP VP Percent: 0.02 %
Brady Statistic AP VS Percent: 9.08 %
Brady Statistic AS VP Percent: 0.02 %
Brady Statistic AS VS Percent: 90.88 %
Brady Statistic RA Percent Paced: 9.11 %
Brady Statistic RV Percent Paced: 0.04 %
Date Time Interrogation Session: 20240306232154
Implantable Lead Connection Status: 753985
Implantable Lead Connection Status: 753985
Implantable Lead Implant Date: 20221207
Implantable Lead Implant Date: 20221207
Implantable Lead Location: 753859
Implantable Lead Location: 753860
Implantable Lead Model: 3830
Implantable Lead Model: 5076
Implantable Pulse Generator Implant Date: 20221207
Lead Channel Impedance Value: 285 Ohm
Lead Channel Impedance Value: 361 Ohm
Lead Channel Impedance Value: 475 Ohm
Lead Channel Impedance Value: 494 Ohm
Lead Channel Pacing Threshold Amplitude: 0.5 V
Lead Channel Pacing Threshold Amplitude: 0.75 V
Lead Channel Pacing Threshold Pulse Width: 0.4 ms
Lead Channel Pacing Threshold Pulse Width: 0.4 ms
Lead Channel Sensing Intrinsic Amplitude: 2.625 mV
Lead Channel Sensing Intrinsic Amplitude: 2.625 mV
Lead Channel Sensing Intrinsic Amplitude: 5.625 mV
Lead Channel Sensing Intrinsic Amplitude: 5.625 mV
Lead Channel Setting Pacing Amplitude: 1.5 V
Lead Channel Setting Pacing Amplitude: 2 V
Lead Channel Setting Pacing Pulse Width: 0.4 ms
Lead Channel Setting Sensing Sensitivity: 0.9 mV
Zone Setting Status: 755011
Zone Setting Status: 755011

## 2023-02-22 DIAGNOSIS — L821 Other seborrheic keratosis: Secondary | ICD-10-CM | POA: Diagnosis not present

## 2023-02-22 DIAGNOSIS — S90821A Blister (nonthermal), right foot, initial encounter: Secondary | ICD-10-CM | POA: Diagnosis not present

## 2023-02-22 DIAGNOSIS — Z08 Encounter for follow-up examination after completed treatment for malignant neoplasm: Secondary | ICD-10-CM | POA: Diagnosis not present

## 2023-02-22 DIAGNOSIS — D485 Neoplasm of uncertain behavior of skin: Secondary | ICD-10-CM | POA: Diagnosis not present

## 2023-02-22 DIAGNOSIS — Z86006 Personal history of melanoma in-situ: Secondary | ICD-10-CM | POA: Diagnosis not present

## 2023-02-22 DIAGNOSIS — I8390 Asymptomatic varicose veins of unspecified lower extremity: Secondary | ICD-10-CM | POA: Diagnosis not present

## 2023-02-22 DIAGNOSIS — L814 Other melanin hyperpigmentation: Secondary | ICD-10-CM | POA: Diagnosis not present

## 2023-02-22 DIAGNOSIS — D225 Melanocytic nevi of trunk: Secondary | ICD-10-CM | POA: Diagnosis not present

## 2023-02-28 NOTE — Progress Notes (Signed)
Remote pacemaker transmission.   

## 2023-03-15 DIAGNOSIS — M62830 Muscle spasm of back: Secondary | ICD-10-CM | POA: Diagnosis not present

## 2023-03-15 DIAGNOSIS — M9905 Segmental and somatic dysfunction of pelvic region: Secondary | ICD-10-CM | POA: Diagnosis not present

## 2023-03-15 DIAGNOSIS — M9903 Segmental and somatic dysfunction of lumbar region: Secondary | ICD-10-CM | POA: Diagnosis not present

## 2023-03-15 DIAGNOSIS — M9902 Segmental and somatic dysfunction of thoracic region: Secondary | ICD-10-CM | POA: Diagnosis not present

## 2023-03-24 DIAGNOSIS — M62830 Muscle spasm of back: Secondary | ICD-10-CM | POA: Diagnosis not present

## 2023-03-24 DIAGNOSIS — M9903 Segmental and somatic dysfunction of lumbar region: Secondary | ICD-10-CM | POA: Diagnosis not present

## 2023-03-24 DIAGNOSIS — M9902 Segmental and somatic dysfunction of thoracic region: Secondary | ICD-10-CM | POA: Diagnosis not present

## 2023-03-24 DIAGNOSIS — M9905 Segmental and somatic dysfunction of pelvic region: Secondary | ICD-10-CM | POA: Diagnosis not present

## 2023-03-29 DIAGNOSIS — M62830 Muscle spasm of back: Secondary | ICD-10-CM | POA: Diagnosis not present

## 2023-03-29 DIAGNOSIS — M9902 Segmental and somatic dysfunction of thoracic region: Secondary | ICD-10-CM | POA: Diagnosis not present

## 2023-03-29 DIAGNOSIS — M9905 Segmental and somatic dysfunction of pelvic region: Secondary | ICD-10-CM | POA: Diagnosis not present

## 2023-03-29 DIAGNOSIS — M9903 Segmental and somatic dysfunction of lumbar region: Secondary | ICD-10-CM | POA: Diagnosis not present

## 2023-04-14 DIAGNOSIS — M9905 Segmental and somatic dysfunction of pelvic region: Secondary | ICD-10-CM | POA: Diagnosis not present

## 2023-04-14 DIAGNOSIS — M9902 Segmental and somatic dysfunction of thoracic region: Secondary | ICD-10-CM | POA: Diagnosis not present

## 2023-04-14 DIAGNOSIS — M9903 Segmental and somatic dysfunction of lumbar region: Secondary | ICD-10-CM | POA: Diagnosis not present

## 2023-04-14 DIAGNOSIS — M62830 Muscle spasm of back: Secondary | ICD-10-CM | POA: Diagnosis not present

## 2023-04-27 ENCOUNTER — Ambulatory Visit (INDEPENDENT_AMBULATORY_CARE_PROVIDER_SITE_OTHER): Payer: Medicare PPO

## 2023-04-27 DIAGNOSIS — I442 Atrioventricular block, complete: Secondary | ICD-10-CM | POA: Diagnosis not present

## 2023-04-27 LAB — CUP PACEART REMOTE DEVICE CHECK
Battery Remaining Longevity: 165 mo
Battery Voltage: 3.04 V
Brady Statistic AP VP Percent: 0.02 %
Brady Statistic AP VS Percent: 10.71 %
Brady Statistic AS VP Percent: 0.03 %
Brady Statistic AS VS Percent: 89.25 %
Brady Statistic RA Percent Paced: 10.75 %
Brady Statistic RV Percent Paced: 0.05 %
Date Time Interrogation Session: 20240605210646
Implantable Lead Connection Status: 753985
Implantable Lead Connection Status: 753985
Implantable Lead Implant Date: 20221207
Implantable Lead Implant Date: 20221207
Implantable Lead Location: 753859
Implantable Lead Location: 753860
Implantable Lead Model: 3830
Implantable Lead Model: 5076
Implantable Pulse Generator Implant Date: 20221207
Lead Channel Impedance Value: 323 Ohm
Lead Channel Impedance Value: 361 Ohm
Lead Channel Impedance Value: 361 Ohm
Lead Channel Impedance Value: 475 Ohm
Lead Channel Pacing Threshold Amplitude: 0.5 V
Lead Channel Pacing Threshold Amplitude: 1.25 V
Lead Channel Pacing Threshold Pulse Width: 0.4 ms
Lead Channel Pacing Threshold Pulse Width: 0.4 ms
Lead Channel Sensing Intrinsic Amplitude: 2 mV
Lead Channel Sensing Intrinsic Amplitude: 2 mV
Lead Channel Sensing Intrinsic Amplitude: 6 mV
Lead Channel Sensing Intrinsic Amplitude: 6 mV
Lead Channel Setting Pacing Amplitude: 1.5 V
Lead Channel Setting Pacing Amplitude: 2.5 V
Lead Channel Setting Pacing Pulse Width: 0.4 ms
Lead Channel Setting Sensing Sensitivity: 0.9 mV
Zone Setting Status: 755011
Zone Setting Status: 755011

## 2023-05-17 NOTE — Progress Notes (Signed)
Remote pacemaker transmission.   

## 2023-06-01 DIAGNOSIS — R928 Other abnormal and inconclusive findings on diagnostic imaging of breast: Secondary | ICD-10-CM | POA: Diagnosis not present

## 2023-06-06 ENCOUNTER — Other Ambulatory Visit: Payer: Self-pay | Admitting: Internal Medicine

## 2023-06-06 DIAGNOSIS — N6019 Diffuse cystic mastopathy of unspecified breast: Secondary | ICD-10-CM

## 2023-06-12 ENCOUNTER — Other Ambulatory Visit: Payer: Self-pay | Admitting: Internal Medicine

## 2023-06-12 DIAGNOSIS — Z1231 Encounter for screening mammogram for malignant neoplasm of breast: Secondary | ICD-10-CM

## 2023-06-20 ENCOUNTER — Ambulatory Visit: Admission: RE | Admit: 2023-06-20 | Payer: Medicare PPO | Source: Ambulatory Visit

## 2023-06-20 DIAGNOSIS — Z1231 Encounter for screening mammogram for malignant neoplasm of breast: Secondary | ICD-10-CM | POA: Diagnosis not present

## 2023-07-05 DIAGNOSIS — E039 Hypothyroidism, unspecified: Secondary | ICD-10-CM | POA: Diagnosis not present

## 2023-07-05 DIAGNOSIS — E782 Mixed hyperlipidemia: Secondary | ICD-10-CM | POA: Diagnosis not present

## 2023-07-06 LAB — LAB REPORT - SCANNED: EGFR: 72

## 2023-07-12 DIAGNOSIS — I4460 Unspecified fascicular block: Secondary | ICD-10-CM | POA: Diagnosis not present

## 2023-07-12 DIAGNOSIS — E039 Hypothyroidism, unspecified: Secondary | ICD-10-CM | POA: Diagnosis not present

## 2023-07-12 DIAGNOSIS — J302 Other seasonal allergic rhinitis: Secondary | ICD-10-CM | POA: Diagnosis not present

## 2023-07-12 DIAGNOSIS — M546 Pain in thoracic spine: Secondary | ICD-10-CM | POA: Diagnosis not present

## 2023-07-12 DIAGNOSIS — R55 Syncope and collapse: Secondary | ICD-10-CM | POA: Diagnosis not present

## 2023-07-12 DIAGNOSIS — Z950 Presence of cardiac pacemaker: Secondary | ICD-10-CM | POA: Diagnosis not present

## 2023-07-12 DIAGNOSIS — I442 Atrioventricular block, complete: Secondary | ICD-10-CM | POA: Diagnosis not present

## 2023-07-12 DIAGNOSIS — I447 Left bundle-branch block, unspecified: Secondary | ICD-10-CM | POA: Diagnosis not present

## 2023-07-12 DIAGNOSIS — J439 Emphysema, unspecified: Secondary | ICD-10-CM | POA: Diagnosis not present

## 2023-07-13 ENCOUNTER — Encounter: Payer: Self-pay | Admitting: Internal Medicine

## 2023-07-27 ENCOUNTER — Ambulatory Visit (INDEPENDENT_AMBULATORY_CARE_PROVIDER_SITE_OTHER): Payer: Medicare PPO

## 2023-07-27 DIAGNOSIS — I442 Atrioventricular block, complete: Secondary | ICD-10-CM

## 2023-08-01 ENCOUNTER — Ambulatory Visit: Payer: Medicare PPO | Attending: Internal Medicine | Admitting: Internal Medicine

## 2023-08-01 ENCOUNTER — Encounter: Payer: Self-pay | Admitting: Internal Medicine

## 2023-08-01 VITALS — BP 124/82 | HR 85 | Ht 65.0 in | Wt 139.4 lb

## 2023-08-01 DIAGNOSIS — Z950 Presence of cardiac pacemaker: Secondary | ICD-10-CM | POA: Diagnosis not present

## 2023-08-01 DIAGNOSIS — I442 Atrioventricular block, complete: Secondary | ICD-10-CM
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
## 2023-08-01 NOTE — Patient Instructions (Signed)

## 2023-08-02 LAB — CUP PACEART REMOTE DEVICE CHECK
Battery Remaining Longevity: 162 mo
Battery Voltage: 3.03 V
Brady Statistic AP VP Percent: 0.02 %
Brady Statistic AP VS Percent: 14.41 %
Brady Statistic AS VP Percent: 0.03 %
Brady Statistic AS VS Percent: 85.54 %
Brady Statistic RA Percent Paced: 14.47 %
Brady Statistic RV Percent Paced: 0.05 %
Date Time Interrogation Session: 20240905071024
Implantable Lead Connection Status: 753985
Implantable Lead Connection Status: 753985
Implantable Lead Implant Date: 20221207
Implantable Lead Implant Date: 20221207
Implantable Lead Location: 753859
Implantable Lead Location: 753860
Implantable Lead Model: 3830
Implantable Lead Model: 5076
Implantable Pulse Generator Implant Date: 20221207
Lead Channel Impedance Value: 323 Ohm
Lead Channel Impedance Value: 380 Ohm
Lead Channel Impedance Value: 380 Ohm
Lead Channel Impedance Value: 494 Ohm
Lead Channel Pacing Threshold Amplitude: 0.5 V
Lead Channel Pacing Threshold Amplitude: 1.375 V
Lead Channel Pacing Threshold Pulse Width: 0.4 ms
Lead Channel Pacing Threshold Pulse Width: 0.4 ms
Lead Channel Sensing Intrinsic Amplitude: 2.625 mV
Lead Channel Sensing Intrinsic Amplitude: 2.625 mV
Lead Channel Sensing Intrinsic Amplitude: 5.75 mV
Lead Channel Sensing Intrinsic Amplitude: 5.75 mV
Lead Channel Setting Pacing Amplitude: 1.5 V
Lead Channel Setting Pacing Amplitude: 2.75 V
Lead Channel Setting Pacing Pulse Width: 0.4 ms
Lead Channel Setting Sensing Sensitivity: 0.9 mV
Zone Setting Status: 755011
Zone Setting Status: 755011

## 2023-08-04 NOTE — Progress Notes (Signed)
Remote pacemaker transmission.   

## 2023-09-08 DIAGNOSIS — D692 Other nonthrombocytopenic purpura: Secondary | ICD-10-CM | POA: Diagnosis not present

## 2023-09-08 DIAGNOSIS — Z86006 Personal history of melanoma in-situ: Secondary | ICD-10-CM | POA: Diagnosis not present

## 2023-09-08 DIAGNOSIS — D225 Melanocytic nevi of trunk: Secondary | ICD-10-CM | POA: Diagnosis not present

## 2023-09-08 DIAGNOSIS — L814 Other melanin hyperpigmentation: Secondary | ICD-10-CM | POA: Diagnosis not present

## 2023-09-08 DIAGNOSIS — L821 Other seborrheic keratosis: Secondary | ICD-10-CM | POA: Diagnosis not present

## 2023-09-08 DIAGNOSIS — Z85820 Personal history of malignant melanoma of skin: Secondary | ICD-10-CM | POA: Diagnosis not present

## 2023-09-08 DIAGNOSIS — Z08 Encounter for follow-up examination after completed treatment for malignant neoplasm: Secondary | ICD-10-CM | POA: Diagnosis not present

## 2023-09-08 DIAGNOSIS — L538 Other specified erythematous conditions: Secondary | ICD-10-CM | POA: Diagnosis not present

## 2023-09-08 DIAGNOSIS — L820 Inflamed seborrheic keratosis: Secondary | ICD-10-CM | POA: Diagnosis not present

## 2023-10-02 ENCOUNTER — Telehealth: Payer: Self-pay | Admitting: Internal Medicine

## 2023-10-02 NOTE — Telephone Encounter (Signed)
Returned call to Pt.  She needs to call tech services to get signed into her MDT app.   She will call and send a transmission.  Advised would check transmission tomorrow morning and give her a call.  She states she was taking a shower and drying her back when she felt a sharp pain over her device.  Since then the site remains sore.  She is concerned about the pacemaker function.  Will follow.

## 2023-10-02 NOTE — Telephone Encounter (Signed)
Pt states that 2 days ago (11/9) when getting out of the shower she thinks she rubbed area where device is a little too hard. Since then pt has been experiencing soreness around that area. She would like a c/b regarding this matter., Please advise

## 2023-10-02 NOTE — Telephone Encounter (Signed)
Left message requesting  call back.

## 2023-10-03 NOTE — Telephone Encounter (Signed)
Returned call to Pt.  Transmission received and reviewed.   Device function WNL.  Advised Pt to continue to monitor.  She advises at this time device site is just sore.  She will continue to monitor for improvement.  Advised to call back if any noted swelling or discomfort does not continue to improve.    Await further needs.

## 2023-10-26 ENCOUNTER — Ambulatory Visit (INDEPENDENT_AMBULATORY_CARE_PROVIDER_SITE_OTHER): Payer: Medicare PPO

## 2023-10-26 DIAGNOSIS — I442 Atrioventricular block, complete: Secondary | ICD-10-CM | POA: Diagnosis not present

## 2023-10-26 LAB — CUP PACEART REMOTE DEVICE CHECK
Battery Remaining Longevity: 158 mo
Battery Voltage: 3.02 V
Brady Statistic AP VP Percent: 1.73 %
Brady Statistic AP VS Percent: 0.15 %
Brady Statistic AS VP Percent: 0.01 %
Brady Statistic AS VS Percent: 98.11 %
Brady Statistic RA Percent Paced: 1.92 %
Brady Statistic RV Percent Paced: 1.74 %
Date Time Interrogation Session: 20241204182936
Implantable Lead Connection Status: 753985
Implantable Lead Connection Status: 753985
Implantable Lead Implant Date: 20221207
Implantable Lead Implant Date: 20221207
Implantable Lead Location: 753859
Implantable Lead Location: 753860
Implantable Lead Model: 3830
Implantable Lead Model: 5076
Implantable Pulse Generator Implant Date: 20221207
Lead Channel Impedance Value: 323 Ohm
Lead Channel Impedance Value: 380 Ohm
Lead Channel Impedance Value: 380 Ohm
Lead Channel Impedance Value: 494 Ohm
Lead Channel Pacing Threshold Amplitude: 0.5 V
Lead Channel Pacing Threshold Amplitude: 1 V
Lead Channel Pacing Threshold Pulse Width: 0.4 ms
Lead Channel Pacing Threshold Pulse Width: 0.4 ms
Lead Channel Sensing Intrinsic Amplitude: 3.125 mV
Lead Channel Sensing Intrinsic Amplitude: 3.125 mV
Lead Channel Sensing Intrinsic Amplitude: 6.125 mV
Lead Channel Sensing Intrinsic Amplitude: 6.125 mV
Lead Channel Setting Pacing Amplitude: 2 V
Lead Channel Setting Pacing Amplitude: 2.5 V
Lead Channel Setting Pacing Pulse Width: 0.4 ms
Lead Channel Setting Sensing Sensitivity: 0.9 mV
Zone Setting Status: 755011
Zone Setting Status: 755011

## 2023-10-30 ENCOUNTER — Ambulatory Visit
Admission: EM | Admit: 2023-10-30 | Discharge: 2023-10-30 | Disposition: A | Discharge status: Home / Self Care | Payer: Medicare PPO | Attending: Family Medicine | Admitting: Family Medicine

## 2023-10-30 ENCOUNTER — Ambulatory Visit: Payer: Medicare PPO

## 2023-10-30 DIAGNOSIS — M79642 Pain in left hand: Secondary | ICD-10-CM | POA: Diagnosis not present

## 2023-10-30 DIAGNOSIS — M1812 Unilateral primary osteoarthritis of first carpometacarpal joint, left hand: Secondary | ICD-10-CM | POA: Diagnosis not present

## 2023-10-30 DIAGNOSIS — W19XXXA Unspecified fall, initial encounter: Secondary | ICD-10-CM

## 2023-10-30 NOTE — ED Triage Notes (Signed)
Pt states she fell today at the grocery store and landed on her left wrist. Pt is now having pain in her left thumb that goes down to her left wrist.

## 2023-10-30 NOTE — Discharge Instructions (Signed)
We have placed a compression wrap on the area today.  You may ice, elevate, take ibuprofen and Tylenol as needed.  We will call if anything comes back abnormal on your x-ray report.
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
## 2023-11-03 DIAGNOSIS — W19XXXA Unspecified fall, initial encounter: Secondary | ICD-10-CM | POA: Diagnosis not present

## 2023-11-03 DIAGNOSIS — M19049 Primary osteoarthritis, unspecified hand: Secondary | ICD-10-CM | POA: Diagnosis not present

## 2023-11-03 DIAGNOSIS — I442 Atrioventricular block, complete: Secondary | ICD-10-CM | POA: Diagnosis not present

## 2023-11-03 DIAGNOSIS — Z950 Presence of cardiac pacemaker: Secondary | ICD-10-CM | POA: Diagnosis not present

## 2023-11-03 DIAGNOSIS — M19042 Primary osteoarthritis, left hand: Secondary | ICD-10-CM | POA: Diagnosis not present

## 2023-11-09 DIAGNOSIS — M1812 Unilateral primary osteoarthritis of first carpometacarpal joint, left hand: Secondary | ICD-10-CM | POA: Diagnosis not present

## 2023-11-16 ENCOUNTER — Other Ambulatory Visit (HOSPITAL_COMMUNITY): Payer: Self-pay | Admitting: Orthopedic Surgery

## 2023-11-16 ENCOUNTER — Encounter (HOSPITAL_COMMUNITY): Payer: Self-pay | Admitting: Orthopedic Surgery

## 2023-11-16 DIAGNOSIS — M189 Osteoarthritis of first carpometacarpal joint, unspecified: Secondary | ICD-10-CM

## 2023-11-23 DIAGNOSIS — R5383 Other fatigue: Secondary | ICD-10-CM | POA: Diagnosis not present

## 2023-11-23 DIAGNOSIS — E039 Hypothyroidism, unspecified: Secondary | ICD-10-CM | POA: Diagnosis not present

## 2023-11-23 DIAGNOSIS — E559 Vitamin D deficiency, unspecified: Secondary | ICD-10-CM | POA: Diagnosis not present

## 2023-11-23 DIAGNOSIS — R5382 Chronic fatigue, unspecified: Secondary | ICD-10-CM | POA: Diagnosis not present

## 2023-11-23 DIAGNOSIS — R799 Abnormal finding of blood chemistry, unspecified: Secondary | ICD-10-CM | POA: Diagnosis not present

## 2023-11-24 ENCOUNTER — Telehealth: Payer: Medicare PPO | Admitting: Nurse Practitioner

## 2023-11-24 DIAGNOSIS — J0140 Acute pansinusitis, unspecified: Secondary | ICD-10-CM

## 2023-11-24 MED ORDER — DOXYCYCLINE HYCLATE 100 MG PO TABS
100.0000 mg | ORAL_TABLET | Freq: Two times a day (BID) | ORAL | 0 refills | Status: AC
Start: 2023-11-24 — End: 2023-12-01

## 2023-11-24 NOTE — Progress Notes (Signed)
 E-Visit for Sinus Problems  We are sorry that you are not feeling well.  Here is how we plan to help!  Based on what you have shared with me it looks like you have sinusitis.  Sinusitis is inflammation and infection in the sinus cavities of the head.  Based on your presentation I believe you most likely have Acute Bacterial Sinusitis.  This is an infection caused by bacteria and is treated with antibiotics. I have prescribed Doxycycline 100mg  by mouth twice a day for 7 days. You may use an oral decongestant such as Mucinex D or if you have glaucoma or high blood pressure use plain Mucinex. Saline nasal spray help and can safely be used as often as needed for congestion.  If you develop worsening sinus pain, fever or notice severe headache and vision changes, or if symptoms are not better after completion of antibiotic, please schedule an appointment with a health care provider.    Sinus infections are not as easily transmitted as other respiratory infection, however we still recommend that you avoid close contact with loved ones, especially the very young and elderly.  Remember to wash your hands thoroughly throughout the day as this is the number one way to prevent the spread of infection!  Home Care: Only take medications as instructed by your medical team. Complete the entire course of an antibiotic. Do not take these medications with alcohol. A steam or ultrasonic humidifier can help congestion.  You can place a towel over your head and breathe in the steam from hot water coming from a faucet. Avoid close contacts especially the very young and the elderly. Cover your mouth when you cough or sneeze. Always remember to wash your hands.  Get Help Right Away If: You develop worsening fever or sinus pain. You develop a severe head ache or visual changes. Your symptoms persist after you have completed your treatment plan.  Make sure you Understand these instructions. Will watch your  condition. Will get help right away if you are not doing well or get worse.  Thank you for choosing an e-visit.  Your e-visit answers were reviewed by a board certified advanced clinical practitioner to complete your personal care plan. Depending upon the condition, your plan could have included both over the counter or prescription medications.  Please review your pharmacy choice. Make sure the pharmacy is open so you can pick up prescription now. If there is a problem, you may contact your provider through [employer] and have the prescription routed to another pharmacy.  Your safety is important to [place]. If you have drug allergies check your prescription carefully.   For the next 24 hours you can use MyChart to ask questions about today's visit, request a non-urgent call back, or ask for a work or school excuse. You will get an email in the next two days asking about your experience. I hope that your e-visit has been valuable and will speed your recovery.   Meds ordered this encounter  Medications   doxycycline (VIBRA-TABS) 100 MG tablet    Sig: Take 1 tablet (100 mg total) by mouth 2 (two) times daily for 7 days.    Dispense:  14 tablet    Refill:  0     I spent approximately 5 minutes reviewing the patient's history, current symptoms and coordinating their care today.

## 2023-12-25 ENCOUNTER — Other Ambulatory Visit (HOSPITAL_COMMUNITY): Payer: Self-pay | Admitting: Orthopedic Surgery

## 2023-12-25 DIAGNOSIS — M189 Osteoarthritis of first carpometacarpal joint, unspecified: Secondary | ICD-10-CM

## 2023-12-26 ENCOUNTER — Ambulatory Visit (HOSPITAL_COMMUNITY): Payer: Medicare PPO

## 2023-12-26 ENCOUNTER — Encounter (HOSPITAL_COMMUNITY): Payer: Self-pay

## 2024-01-01 ENCOUNTER — Encounter (HOSPITAL_COMMUNITY): Payer: Self-pay

## 2024-01-01 ENCOUNTER — Ambulatory Visit (HOSPITAL_COMMUNITY): Payer: Medicare PPO

## 2024-01-05 DIAGNOSIS — E039 Hypothyroidism, unspecified: Secondary | ICD-10-CM | POA: Diagnosis not present

## 2024-01-12 DIAGNOSIS — I4460 Unspecified fascicular block: Secondary | ICD-10-CM | POA: Diagnosis not present

## 2024-01-12 DIAGNOSIS — H5203 Hypermetropia, bilateral: Secondary | ICD-10-CM | POA: Diagnosis not present

## 2024-01-12 DIAGNOSIS — J439 Emphysema, unspecified: Secondary | ICD-10-CM | POA: Diagnosis not present

## 2024-01-12 DIAGNOSIS — K219 Gastro-esophageal reflux disease without esophagitis: Secondary | ICD-10-CM | POA: Diagnosis not present

## 2024-01-12 DIAGNOSIS — H2513 Age-related nuclear cataract, bilateral: Secondary | ICD-10-CM | POA: Diagnosis not present

## 2024-01-12 DIAGNOSIS — D3131 Benign neoplasm of right choroid: Secondary | ICD-10-CM | POA: Diagnosis not present

## 2024-01-12 DIAGNOSIS — H52223 Regular astigmatism, bilateral: Secondary | ICD-10-CM | POA: Diagnosis not present

## 2024-01-12 DIAGNOSIS — I442 Atrioventricular block, complete: Secondary | ICD-10-CM | POA: Diagnosis not present

## 2024-01-12 DIAGNOSIS — E039 Hypothyroidism, unspecified: Secondary | ICD-10-CM | POA: Diagnosis not present

## 2024-01-12 DIAGNOSIS — H524 Presbyopia: Secondary | ICD-10-CM | POA: Diagnosis not present

## 2024-01-12 DIAGNOSIS — E782 Mixed hyperlipidemia: Secondary | ICD-10-CM | POA: Diagnosis not present

## 2024-01-12 DIAGNOSIS — R7301 Impaired fasting glucose: Secondary | ICD-10-CM | POA: Diagnosis not present

## 2024-01-12 DIAGNOSIS — R928 Other abnormal and inconclusive findings on diagnostic imaging of breast: Secondary | ICD-10-CM | POA: Diagnosis not present

## 2024-01-12 DIAGNOSIS — L729 Follicular cyst of the skin and subcutaneous tissue, unspecified: Secondary | ICD-10-CM | POA: Diagnosis not present

## 2024-01-25 ENCOUNTER — Ambulatory Visit: Payer: Medicare PPO

## 2024-01-25 DIAGNOSIS — I442 Atrioventricular block, complete: Secondary | ICD-10-CM

## 2024-01-28 LAB — CUP PACEART REMOTE DEVICE CHECK
Battery Remaining Longevity: 156 mo
Battery Voltage: 3.02 V
Brady Statistic AP VP Percent: 0.92 %
Brady Statistic AP VS Percent: 0.14 %
Brady Statistic AS VP Percent: 0 %
Brady Statistic AS VS Percent: 98.94 %
Brady Statistic RA Percent Paced: 1.08 %
Brady Statistic RV Percent Paced: 0.92 %
Date Time Interrogation Session: 20250306073532
Implantable Lead Connection Status: 753985
Implantable Lead Connection Status: 753985
Implantable Lead Implant Date: 20221207
Implantable Lead Implant Date: 20221207
Implantable Lead Location: 753859
Implantable Lead Location: 753860
Implantable Lead Model: 3830
Implantable Lead Model: 5076
Implantable Pulse Generator Implant Date: 20221207
Lead Channel Impedance Value: 304 Ohm
Lead Channel Impedance Value: 361 Ohm
Lead Channel Impedance Value: 361 Ohm
Lead Channel Impedance Value: 494 Ohm
Lead Channel Pacing Threshold Amplitude: 0.5 V
Lead Channel Pacing Threshold Amplitude: 1 V
Lead Channel Pacing Threshold Pulse Width: 0.4 ms
Lead Channel Pacing Threshold Pulse Width: 0.4 ms
Lead Channel Sensing Intrinsic Amplitude: 1.625 mV
Lead Channel Sensing Intrinsic Amplitude: 1.625 mV
Lead Channel Sensing Intrinsic Amplitude: 6.75 mV
Lead Channel Sensing Intrinsic Amplitude: 6.75 mV
Lead Channel Setting Pacing Amplitude: 2 V
Lead Channel Setting Pacing Amplitude: 2.5 V
Lead Channel Setting Pacing Pulse Width: 0.4 ms
Lead Channel Setting Sensing Sensitivity: 0.9 mV
Zone Setting Status: 755011
Zone Setting Status: 755011

## 2024-02-09 ENCOUNTER — Ambulatory Visit (HOSPITAL_COMMUNITY): Payer: Medicare PPO

## 2024-02-13 ENCOUNTER — Ambulatory Visit (HOSPITAL_COMMUNITY)
Admission: RE | Admit: 2024-02-13 | Discharge: 2024-02-13 | Disposition: A | Discharge status: Home / Self Care | Source: Ambulatory Visit | Attending: Orthopedic Surgery | Admitting: Orthopedic Surgery

## 2024-02-13 DIAGNOSIS — M19042 Primary osteoarthritis, left hand: Secondary | ICD-10-CM | POA: Diagnosis not present

## 2024-02-13 DIAGNOSIS — M79642 Pain in left hand: Secondary | ICD-10-CM | POA: Diagnosis not present

## 2024-02-13 DIAGNOSIS — M189 Osteoarthritis of first carpometacarpal joint, unspecified: Secondary | ICD-10-CM | POA: Diagnosis not present

## 2024-02-13 DIAGNOSIS — M1812 Unilateral primary osteoarthritis of first carpometacarpal joint, left hand: Secondary | ICD-10-CM | POA: Diagnosis not present

## 2024-03-01 NOTE — Addendum Note (Signed)
 Addended by: Elease Etienne A on: 03/01/2024 10:22 AM   Modules accepted: Orders

## 2024-03-01 NOTE — Progress Notes (Signed)
 Remote pacemaker transmission.

## 2024-03-04 ENCOUNTER — Encounter: Payer: Self-pay | Admitting: Internal Medicine

## 2024-03-13 DIAGNOSIS — D225 Melanocytic nevi of trunk: Secondary | ICD-10-CM | POA: Diagnosis not present

## 2024-03-13 DIAGNOSIS — L814 Other melanin hyperpigmentation: Secondary | ICD-10-CM | POA: Diagnosis not present

## 2024-03-13 DIAGNOSIS — L821 Other seborrheic keratosis: Secondary | ICD-10-CM | POA: Diagnosis not present

## 2024-03-13 DIAGNOSIS — Z08 Encounter for follow-up examination after completed treatment for malignant neoplasm: Secondary | ICD-10-CM | POA: Diagnosis not present

## 2024-03-13 DIAGNOSIS — Z86006 Personal history of melanoma in-situ: Secondary | ICD-10-CM | POA: Diagnosis not present

## 2024-04-16 DIAGNOSIS — M19042 Primary osteoarthritis, left hand: Secondary | ICD-10-CM | POA: Diagnosis not present

## 2024-04-22 ENCOUNTER — Ambulatory Visit: Admitting: Sports Medicine

## 2024-04-22 ENCOUNTER — Other Ambulatory Visit (INDEPENDENT_AMBULATORY_CARE_PROVIDER_SITE_OTHER)

## 2024-04-22 ENCOUNTER — Encounter: Payer: Self-pay | Admitting: Sports Medicine

## 2024-04-22 DIAGNOSIS — G8929 Other chronic pain: Secondary | ICD-10-CM

## 2024-04-22 DIAGNOSIS — Z950 Presence of cardiac pacemaker: Secondary | ICD-10-CM

## 2024-04-22 DIAGNOSIS — M21371 Foot drop, right foot: Secondary | ICD-10-CM | POA: Diagnosis not present

## 2024-04-22 DIAGNOSIS — R29898 Other symptoms and signs involving the musculoskeletal system: Secondary | ICD-10-CM

## 2024-04-22 DIAGNOSIS — M4316 Spondylolisthesis, lumbar region: Secondary | ICD-10-CM

## 2024-04-22 DIAGNOSIS — M545 Low back pain, unspecified: Secondary | ICD-10-CM

## 2024-04-22 DIAGNOSIS — M51362 Other intervertebral disc degeneration, lumbar region with discogenic back pain and lower extremity pain: Secondary | ICD-10-CM
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
## 2024-04-22 NOTE — Progress Notes (Signed)
 Patient says that she has had low back pain and pain in the posterior right hip since 2020 that has gotten worse over the last year. She says that she has constant pain that gets worse if she walks for extended periods of time, sits for extended periods of time, and lays in bed. She denies any pain, numbness, or tingling down the legs, but does describe weakness in the right leg. She feels that she does not walk normally and drags her right leg, and she has to use her hands to help anytime she has to lift the leg (into the car). She has fallen several times due to the right leg weakness. She has taken Meloxicam in the past with little to no relief. She says that a heating pad does give her temporary relief, as well as Aleve only as needed. 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

## 2024-04-25 ENCOUNTER — Ambulatory Visit (INDEPENDENT_AMBULATORY_CARE_PROVIDER_SITE_OTHER): Payer: Medicare PPO

## 2024-04-25 DIAGNOSIS — I442 Atrioventricular block, complete: Secondary | ICD-10-CM

## 2024-04-25 LAB — CUP PACEART REMOTE DEVICE CHECK
Battery Remaining Longevity: 153 mo
Battery Voltage: 3.02 V
Brady Statistic AP VP Percent: 1.95 %
Brady Statistic AP VS Percent: 0.19 %
Brady Statistic AS VP Percent: 0.01 %
Brady Statistic AS VS Percent: 97.85 %
Brady Statistic RA Percent Paced: 2.16 %
Brady Statistic RV Percent Paced: 1.96 %
Date Time Interrogation Session: 20250604184514
Implantable Lead Connection Status: 753985
Implantable Lead Connection Status: 753985
Implantable Lead Implant Date: 20221207
Implantable Lead Implant Date: 20221207
Implantable Lead Location: 753859
Implantable Lead Location: 753860
Implantable Lead Model: 3830
Implantable Lead Model: 5076
Implantable Pulse Generator Implant Date: 20221207
Lead Channel Impedance Value: 323 Ohm
Lead Channel Impedance Value: 361 Ohm
Lead Channel Impedance Value: 380 Ohm
Lead Channel Impedance Value: 475 Ohm
Lead Channel Pacing Threshold Amplitude: 0.5 V
Lead Channel Pacing Threshold Amplitude: 1 V
Lead Channel Pacing Threshold Pulse Width: 0.4 ms
Lead Channel Pacing Threshold Pulse Width: 0.4 ms
Lead Channel Sensing Intrinsic Amplitude: 1.875 mV
Lead Channel Sensing Intrinsic Amplitude: 1.875 mV
Lead Channel Sensing Intrinsic Amplitude: 6.25 mV
Lead Channel Sensing Intrinsic Amplitude: 6.25 mV
Lead Channel Setting Pacing Amplitude: 2 V
Lead Channel Setting Pacing Amplitude: 2.5 V
Lead Channel Setting Pacing Pulse Width: 0.4 ms
Lead Channel Setting Sensing Sensitivity: 0.9 mV
Zone Setting Status: 755011
Zone Setting Status: 755011

## 2024-05-01 NOTE — CV Procedure (Signed)
  Device system confirmed to be MRI conditional, with implant date > 6 weeks ago, and no evidence of abandoned or epicardial leads in review of most recent CXR  Device last cleared by EP Provider: Valeri Gate 05/01/24  Clearance is good through for 1 year as long as parameters remain stable at time of check. If pt undergoes a cardiac device procedure during that time, they should be re-cleared.   Tachy-therapies to be programmed off if applicable with device back to pre-MRI settings after completion of exam.  [employer] - Programming recommendation received through [employer] App/Tablet  Arlys Lamer, RT  05/01/2024 8:48 AM

## 2024-05-03 ENCOUNTER — Ambulatory Visit: Payer: Self-pay | Admitting: Cardiology

## 2024-05-07 ENCOUNTER — Ambulatory Visit (HOSPITAL_COMMUNITY)
Admission: RE | Admit: 2024-05-07 | Discharge: 2024-05-07 | Disposition: A | Discharge status: Home / Self Care | Source: Ambulatory Visit | Attending: Sports Medicine | Admitting: Sports Medicine

## 2024-05-07 DIAGNOSIS — R29898 Other symptoms and signs involving the musculoskeletal system: Secondary | ICD-10-CM | POA: Insufficient documentation

## 2024-05-07 DIAGNOSIS — M4316 Spondylolisthesis, lumbar region: Secondary | ICD-10-CM | POA: Diagnosis not present

## 2024-05-07 DIAGNOSIS — M21371 Foot drop, right foot: Secondary | ICD-10-CM | POA: Diagnosis not present

## 2024-05-07 DIAGNOSIS — M545 Low back pain, unspecified: Secondary | ICD-10-CM | POA: Diagnosis not present

## 2024-05-07 DIAGNOSIS — M5137 Other intervertebral disc degeneration, lumbosacral region with discogenic back pain only: Secondary | ICD-10-CM | POA: Diagnosis not present

## 2024-05-07 DIAGNOSIS — M5116 Intervertebral disc disorders with radiculopathy, lumbar region: Secondary | ICD-10-CM | POA: Diagnosis not present

## 2024-05-07 DIAGNOSIS — M4726 Other spondylosis with radiculopathy, lumbar region: Secondary | ICD-10-CM | POA: Diagnosis not present

## 2024-05-07 DIAGNOSIS — M51362 Other intervertebral disc degeneration, lumbar region with discogenic back pain and lower extremity pain: Secondary | ICD-10-CM | POA: Insufficient documentation

## 2024-05-07 DIAGNOSIS — G8929 Other chronic pain: Secondary | ICD-10-CM | POA: Insufficient documentation

## 2024-05-09 ENCOUNTER — Encounter: Payer: Self-pay | Admitting: Sports Medicine

## 2024-05-09 ENCOUNTER — Other Ambulatory Visit: Payer: Self-pay | Admitting: Sports Medicine

## 2024-05-09 DIAGNOSIS — R29898 Other symptoms and signs involving the musculoskeletal system: Secondary | ICD-10-CM

## 2024-05-09 DIAGNOSIS — M21371 Foot drop, right foot: Secondary | ICD-10-CM

## 2024-05-10 ENCOUNTER — Other Ambulatory Visit: Payer: Self-pay

## 2024-05-10 DIAGNOSIS — R202 Paresthesia of skin: Secondary | ICD-10-CM

## 2024-05-17 ENCOUNTER — Ambulatory Visit: Admitting: Sports Medicine

## 2024-05-17 ENCOUNTER — Ambulatory Visit: Admitting: Neurology

## 2024-05-17 DIAGNOSIS — R202 Paresthesia of skin: Secondary | ICD-10-CM

## 2024-05-17 DIAGNOSIS — R29898 Other symptoms and signs involving the musculoskeletal system: Secondary | ICD-10-CM

## 2024-05-17 DIAGNOSIS — M51362 Other intervertebral disc degeneration, lumbar region with discogenic back pain and lower extremity pain: Secondary | ICD-10-CM | POA: Diagnosis not present

## 2024-05-17 DIAGNOSIS — R94131 Abnormal electromyogram [EMG]: Secondary | ICD-10-CM | POA: Diagnosis not present

## 2024-05-17 DIAGNOSIS — R269 Unspecified abnormalities of gait and mobility: Secondary | ICD-10-CM

## 2024-05-17 DIAGNOSIS — M21371 Foot drop, right foot: Secondary | ICD-10-CM | POA: Diagnosis not present

## 2024-05-17 NOTE — Progress Notes (Unsigned)
 Pt is here to review her Lumbar MRI results. Pt was unable to get the brace requested, so she has not been using one. Pt states pain has worsened over the last few days, she says she does take aleve occasionally and uses topical analgesics. Pt states that the pain is now radiating down to her knee and denies any numbness and tingling. She also states that the pain is crampy and her toes will cramp as well.

## 2024-05-17 NOTE — Procedures (Signed)
 [facility]  [address], [address]  [place], [place] [address] Tel: [phone] Fax: [phone] Test Date:  05/17/2024  Patient: Kathleen Reed DOB: May 17, 1956 Physician: [name], DO  Sex: Female Height: 5' 5 Ref Phys: Lonell Sprang, DO  ID#: [id number]   Technician:    History: This is a 68 year old female referred for evaluation of right foot drop.  NCV & EMG Findings: Extensive electrodiagnostic testing of the right lower extremity and additional study of the left shows:  Right sural and bilateral superficial peroneal sensory responses are symmetric and within normal limits. Right peroneal motor response at the extensor digitorum brevis and tibialis anterior shows reduced amplitude (R1.0, R2.7 mV).  Left peroneal and right tibial motor responses are within normal limits.   Right tibial H reflex study is within normal limits.   Diffuse active on chronic motor axon loss changes are seen affecting all the tested muscles of the lower extremity which is worse on the right.  Additionally, active fibrillation potentials are present in the lumbar paraspinal muscles. Fasciculation potentials are not seen in any of the tested muscles.  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  Recommend neurological consultation.   ___________________________ [name], DO    Nerve Conduction Studies   Stim Site NR Peak (ms) Norm Peak (ms) O-P Amp (V) Norm O-P Amp  Left Sup Peroneal Anti Sensory (Ant Lat Mall)  32 C  12 cm    2.6 <4.6 16.2 >3  Right Sup Peroneal Anti Sensory (Ant Lat Mall)  32 C  12 cm    2.6 <4.6 12.7 >3  Right Sural Anti Sensory (Lat Mall)  32 C  Calf    3.2 <4.6 24.0 >3     Stim Site NR Onset (ms) Norm Onset (ms) O-P Amp (mV)  Norm O-P Amp Site1 Site2 Delta-0 (ms) Dist (cm) Vel (m/s) Norm Vel (m/s)  Left Peroneal Motor (Ext Dig Brev)  32 C  Ankle    3.5 <6.0 4.2 >2.5 B Fib Ankle 7.6 39.0 51 >40  B Fib    11.1  3.5  Poplt B Fib 1.5 8.0 53 >40  Poplt    12.6  3.4         Right Peroneal Motor (Ext Dig Brev)  32 C  Ankle    3.5 <6.0 *1.0 >2.5 B Fib Ankle 8.9 38.0 43 >40  B Fib    12.4  0.9  Poplt B Fib 1.4 8.0 57 >40  Poplt    13.8  0.9         Left Peroneal TA Motor (Tib Ant)  32 C  Fib Head    3.7 <4.5 4.5 >3 Poplit Fib Head 1.3 8.0 62 >40  Poplit    5.0 <5.7 4.3         Right Peroneal TA Motor (Tib Ant)  32 C  Fib Head    3.4 <4.5 *2.7 >3 Poplit Fib Head 1.4 8.0 57 >40  Poplit    4.8 <5.7 2.6         Right Tibial Motor (Abd Hall Brev)  32 C  Ankle    4.1 <6.0 9.9 >4 Knee Ankle 8.6 42.0 49 >40  Knee    12.7  7.0          Electromyography   Side Muscle  Ins.Act Fibs Fasc Recrt Amp Dur Poly Activation Comment  Right AntTibialis Nml *2+ Nml *3- *1+ *1+ *1+ Nml N/A  Right Gastroc Nml *1+ Nml *2- *1+ *1+ *1+ Nml N/A  Right Flex Dig Long Nml *1+ Nml *3- *1+ *1+ *1+ Nml N/A  Right RectFemoris Nml Nml Nml *3- *2+ *2+ *1+ Nml N/A  Right BicepsFemS Nml *1+ Nml *2- *1+ *1+ *1+ Nml N/A  Right GluteusMed Nml *2+ Nml *2- *1+ *1+ *1+ Nml N/A  Right AdductorLong Nml Nml Nml *1- *1+ *1+ *1+ Nml N/A  Right Lumbo Parasp Low Nml *2+ Nml *None *- *- *- *N.E. N/A  Left AntTibialis Nml *1+ Nml *2- *1+ *1+ *1+ Nml N/A  Left Gastroc Nml *1+ Nml *2- *1+ *1+ *1+ Nml N/A  Left RectFemoris Nml Nml Nml *2- *1+ *1+ *1+ Nml N/A      Waveforms:
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
## 2024-05-18 ENCOUNTER — Encounter: Payer: Self-pay | Admitting: Sports Medicine

## 2024-05-22 ENCOUNTER — Ambulatory Visit: Admitting: Neurology

## 2024-05-22 ENCOUNTER — Encounter: Payer: Self-pay | Admitting: Neurology

## 2024-05-22 ENCOUNTER — Telehealth: Payer: Self-pay | Admitting: Neurology

## 2024-05-22 ENCOUNTER — Other Ambulatory Visit

## 2024-05-22 VITALS — BP 117/63 | HR 71 | Ht 65.0 in | Wt 140.0 lb

## 2024-05-22 DIAGNOSIS — R29898 Other symptoms and signs involving the musculoskeletal system: Secondary | ICD-10-CM

## 2024-05-22 DIAGNOSIS — R292 Abnormal reflex: Secondary | ICD-10-CM | POA: Diagnosis not present

## 2024-05-22 DIAGNOSIS — G1220 Motor neuron disease, unspecified: Secondary | ICD-10-CM

## 2024-05-22 DIAGNOSIS — M21371 Foot drop, right foot: Secondary | ICD-10-CM | POA: Diagnosis not present
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
## 2024-05-22 NOTE — Telephone Encounter (Signed)
 Called patient and left message per DPR that I have received her message and I have switched her order. I will call once PA is completed so we can get her scheduled.

## 2024-05-22 NOTE — Addendum Note (Signed)
 Addended by: DASIE RHODY A on: 05/22/2024 03:47 PM   Modules accepted: Orders

## 2024-05-22 NOTE — Patient Instructions (Addendum)
 Check labs Nerve testing of the arms MRI cervical spine We will refer you to [facility] for left AFO

## 2024-05-22 NOTE — Telephone Encounter (Signed)
 Pt called and states that we sent her for a MRI and she can not have it done at [facility] because she has a Pacemaker. She states that it has to be done at the [facility]

## 2024-05-27 DIAGNOSIS — R799 Abnormal finding of blood chemistry, unspecified: Secondary | ICD-10-CM | POA: Diagnosis not present

## 2024-05-27 DIAGNOSIS — R5382 Chronic fatigue, unspecified: Secondary | ICD-10-CM | POA: Diagnosis not present

## 2024-05-27 DIAGNOSIS — R5383 Other fatigue: Secondary | ICD-10-CM | POA: Diagnosis not present

## 2024-05-27 DIAGNOSIS — E039 Hypothyroidism, unspecified: Secondary | ICD-10-CM | POA: Diagnosis not present

## 2024-05-27 DIAGNOSIS — E559 Vitamin D deficiency, unspecified: Secondary | ICD-10-CM | POA: Diagnosis not present

## 2024-05-29 NOTE — Progress Notes (Signed)
 PA done through [employer]. Approved. Authorization #: [id number] Tracking#[id number]. Valid from 05/30/24-07/29/2024

## 2024-05-29 NOTE — Progress Notes (Signed)
 Called patient and informed her that PA approved and location is for Kathleen Reed due to pacemaker. Patient was provided central scheduling phone number and had no further questions and concerns.

## 2024-05-29 NOTE — Addendum Note (Signed)
 Addended by: DASIE RHODY A on: 05/29/2024 03:18 PM   Modules accepted: Orders

## 2024-05-30 ENCOUNTER — Ambulatory Visit: Payer: Self-pay | Admitting: Neurology

## 2024-05-30 LAB — PROTEIN ELECTROPHORESIS, SERUM
Albumin ELP: 4.5 g/dL (ref 3.8–4.8)
Alpha 1: 0.3 g/dL (ref 0.2–0.3)
Alpha 2: 0.5 g/dL (ref 0.5–0.9)
Beta 2: 0.2 g/dL (ref 0.2–0.5)
Beta Globulin: 0.4 g/dL (ref 0.4–0.6)
Gamma Globulin: 0.8 g/dL (ref 0.8–1.7)
Total Protein: 6.8 g/dL (ref 6.1–8.1)

## 2024-05-30 LAB — CK: Total CK: 232 U/L — ABNORMAL HIGH (ref 18–225)

## 2024-05-30 LAB — SEDIMENTATION RATE: Sed Rate: 9 mm/h (ref 0–30)

## 2024-05-30 LAB — B12 AND FOLATE PANEL
Folate: 23.6 ng/mL
Vitamin B-12: 503 pg/mL (ref 200–1100)

## 2024-05-30 LAB — IMMUNOFIXATION ELECTROPHORESIS
IgM, Serum: 181 mg/dL (ref 50–300)
IgM, Serum: 844 mg/dL (ref 600–300)
Immunoglobulin A: 84 mg/dL (ref 70–320)
Immunoglobulin A: 844 mg/dL (ref 70–320)

## 2024-05-30 LAB — B. BURGDORFI ANTIBODIES: B burgdorferi Ab IgG+IgM: <0.9 {index}

## 2024-05-30 LAB — COPPER, SERUM: Copper: 116 ug/dL (ref 70–175)

## 2024-05-30 LAB — PARATHYROID HORMONE, INTACT (NO CA): PTH: 41 pg/mL (ref 16–77)

## 2024-05-30 LAB — HEAVY METALS PANEL, BLOOD
Arsenic: 3 ug/L (ref <23)
Lead: 1.1 ug/dL (ref <3.5)
Mercury, B: <5 ug/L (ref <11)

## 2024-05-30 LAB — C-REACTIVE PROTEIN: CRP: <3 mg/L (ref <8.0)

## 2024-06-11 ENCOUNTER — Telehealth: Payer: Self-pay

## 2024-06-11 NOTE — Telephone Encounter (Signed)
 Called patient and informed her that we do not need any additional labs. Patient verbalized understanding and had no further questions or concerns.

## 2024-06-11 NOTE — Telephone Encounter (Signed)
 Patient called and wanted to know if Dr. [name] wanted/ordered more blood work on her?

## 2024-06-11 NOTE — Telephone Encounter (Signed)
 No additional labs needed.

## 2024-06-12 NOTE — Progress Notes (Signed)
 Remote pacemaker transmission.

## 2024-06-13 NOTE — Progress Notes (Signed)
  Device system confirmed to be MRI conditional, with implant date > 6 weeks ago, and no evidence of abandoned or epicardial leads in review of most recent CXR  Device last cleared by EP Provider: Prentice Passey PA on 05/07/2024  Clearance is good through for 1 year as long as parameters remain stable at time of check. If pt undergoes a cardiac device procedure during that time, they should be re-cleared.   Tachy-therapies to be programmed off if applicable with device back to pre-MRI settings after completion of exam.  [employer] - Programming recommendation received through [employer] App/Tablet  Suzan Recardo Arna Debby  06/13/2024 7:36 AM

## 2024-06-14 ENCOUNTER — Encounter: Payer: Self-pay | Admitting: Neurology

## 2024-06-14 ENCOUNTER — Encounter: Admitting: Neurology

## 2024-06-14 DIAGNOSIS — Z029 Encounter for administrative examinations, unspecified: Secondary | ICD-10-CM

## 2024-06-18 ENCOUNTER — Ambulatory Visit (HOSPITAL_COMMUNITY)
Admission: RE | Admit: 2024-06-18 | Discharge: 2024-06-18 | Disposition: A | Discharge status: Home / Self Care | Source: Ambulatory Visit | Attending: Neurology | Admitting: Neurology

## 2024-06-18 DIAGNOSIS — M50221 Other cervical disc displacement at C4-C5 level: Secondary | ICD-10-CM | POA: Diagnosis not present

## 2024-06-18 DIAGNOSIS — M21371 Foot drop, right foot: Secondary | ICD-10-CM | POA: Insufficient documentation

## 2024-06-18 DIAGNOSIS — M50223 Other cervical disc displacement at C6-C7 level: Secondary | ICD-10-CM | POA: Diagnosis not present

## 2024-06-18 DIAGNOSIS — R29898 Other symptoms and signs involving the musculoskeletal system: Secondary | ICD-10-CM | POA: Insufficient documentation

## 2024-06-18 DIAGNOSIS — M4802 Spinal stenosis, cervical region: Secondary | ICD-10-CM | POA: Diagnosis not present

## 2024-06-18 DIAGNOSIS — R292 Abnormal reflex: Secondary | ICD-10-CM | POA: Diagnosis not present

## 2024-06-18 DIAGNOSIS — M50222 Other cervical disc displacement at C5-C6 level: Secondary | ICD-10-CM | POA: Diagnosis not present

## 2024-06-19 ENCOUNTER — Ambulatory Visit: Admitting: Neurology

## 2024-06-28 ENCOUNTER — Ambulatory Visit: Admitting: Neurology

## 2024-06-28 DIAGNOSIS — R531 Weakness: Secondary | ICD-10-CM | POA: Diagnosis not present

## 2024-06-28 NOTE — Procedures (Signed)
 [facility]  [address] [place], [address]  [place], [place] [address] Tel: [phone] Fax: [phone] Test Date:  06/28/2024  Patient: Kathleen Reed DOB: Dec 08, 1955 Physician: [name], DO  Sex: Female Height: 5' 5 Ref Phys: [name], DO  ID#: [id number]   Technician:    History: This is a 68 year old female referred for evaluation of generalized weakness.  NCV & EMG Findings: Extensive electrodiagnostic testing of the right upper extremity and additional studies of the left shows: Bilateral median and ulnar sensory responses are within normal limits. Bilateral median and ulnar motor responses are within normal limits.  Of note, there is a right Martin-Gruber anastomosis, a normal anatomic variant. Chronic motor axonal loss changes are seen affecting the bilateral C5, C6, C7, and right C8 myotomes with sparse fibrillation potentials isolated to the right first dorsal interosseous muscle. There is no active denervation involving the thoracic paraspinal muscles (T7 and T11). Fasciculation potentials are not seen in any of the tested muscles.  Impression: The electrophysiologic findings show chronic neurogenic changes involving bilateral C5, C6, C7, and right C8 nerve roots/segments with very sparse active changes.   ___________________________ [name], DO    Nerve Conduction Studies   Stim Site NR Peak (ms) Norm Peak (ms) O-P Amp (V) Norm O-P Amp  Left Median Anti Sensory (2nd Digit)  32 C  Wrist    3.1 <3.8 23.7 >10  Right Median Anti Sensory (2nd Digit)  32 C  Wrist    2.9 <3.8 25.9 >10  Left Ulnar Anti Sensory (5th Digit)  32 C  Wrist    2.8 <3.2 25.9 >5  Right Ulnar Anti Sensory (5th Digit)  32 C  Wrist    2.7 <3.2 28.7 >5     Stim Site NR Onset (ms) Norm Onset (ms) O-P Amp (mV) Norm O-P Amp Site1 Site2 Delta-0 (ms) Dist (cm) Vel (m/s) Norm Vel (m/s)  Left Median Motor (Abd Poll Brev)  32 C  Wrist    3.0 <4.0 6.7 >5 Elbow Wrist 4.6 27.0  59 >50  Elbow    7.6  6.7         Right Median Motor (Abd Poll Brev)  32 C  Wrist    2.9 <4.0 5.7 >5 Elbow Wrist 5.0 26.0 52 >50  Elbow    7.9  5.1  Ulnar-wrist crossover Elbow 4.0 0.0    Ulnar-wrist crossover    3.9  1.7         Left Ulnar Motor (Abd Dig Minimi)  32 C  Wrist    2.3 <3.1 8.7 >7 B Elbow Wrist 3.4 20.0 59 >50  B Elbow    5.7  8.4  A Elbow B Elbow 1.4 10.0 71 >50  A Elbow    7.1  8.2         Right Ulnar Motor (Abd Dig Minimi)  32 C  Wrist    3.0 <3.1 10.4 >7 B Elbow Wrist 3.5 21.0 60 >50  B Elbow    6.5  9.4  A Elbow B Elbow 1.4 10.0 71 >50  A Elbow    7.9  9.2         Left Ulnar (FDI) Motor (1st DI)  32 C  Wrist    3.6 <4.5 11.6 >7        Right Ulnar (FDI) Motor (1st DI)  32 C  Wrist    3.9 <4.5 8.7 >7         Electromyography  Side Muscle Ins.Act Fibs Fasc Recrt Amp Dur Poly Activation Comment  Right 1stDorInt Nml *1+ Nml *2- *1+ *1+ *1+ Nml N/A  Right Abd Poll Brev Nml Nml Nml *1- *1+ *1+ *1+ Nml N/A  Right PronatorTeres Nml Nml Nml *2- *1+ *1+ *1+ Nml N/A  Right Biceps Nml Nml Nml *2- *1+ *1+ *1+ Nml N/A  Right Triceps Nml Nml Nml *2- *1+ *1+ *1+ Nml N/A  Right Deltoid Nml Nml Nml *2- *1+ *1+ *1+ Nml N/A  Right Ext Indicis Nml Nml Nml *1- *1+ *1+ *1+ Nml N/A  Left 1stDorInt Nml Nml Nml Nml Nml Nml Nml Nml N/A  Left PronatorTeres Nml Nml Nml *2- *1+ *1+ *1+ Nml N/A  Left Biceps *CRD Nml Nml *2- *1+ *1+ *1+ Nml N/A  Left Triceps Nml Nml Nml *1- *1+ *1+ *1+ Nml N/A  Left Deltoid Nml Nml Nml *1- *1+ *1+ *1+ Nml N/A  Left Abd Poll Brev Nml Nml Nml *2- *1+ *1+ *1+ Nml N/A  Left Ext Indicis Nml Nml Nml Nml Nml Nml Nml Nml N/A  Right Thoracic Parasp Mid Nml Nml Nml - - - - Nml N/A  Right Thoracic Parasp Low Nml Nml Nml - - - - Nml N/A      Waveforms:

## 2024-07-02 DIAGNOSIS — I4460 Unspecified fascicular block: Secondary | ICD-10-CM | POA: Diagnosis not present

## 2024-07-02 DIAGNOSIS — J302 Other seasonal allergic rhinitis: Secondary | ICD-10-CM | POA: Diagnosis not present

## 2024-07-02 DIAGNOSIS — R29898 Other symptoms and signs involving the musculoskeletal system: Secondary | ICD-10-CM | POA: Diagnosis not present

## 2024-07-02 DIAGNOSIS — M19049 Primary osteoarthritis, unspecified hand: Secondary | ICD-10-CM | POA: Diagnosis not present

## 2024-07-02 DIAGNOSIS — M545 Low back pain, unspecified: Secondary | ICD-10-CM | POA: Diagnosis not present

## 2024-07-02 DIAGNOSIS — J439 Emphysema, unspecified: Secondary | ICD-10-CM | POA: Diagnosis not present

## 2024-07-02 DIAGNOSIS — K219 Gastro-esophageal reflux disease without esophagitis: Secondary | ICD-10-CM | POA: Diagnosis not present

## 2024-07-02 DIAGNOSIS — M19042 Primary osteoarthritis, left hand: Secondary | ICD-10-CM | POA: Diagnosis not present

## 2024-07-03 ENCOUNTER — Encounter: Payer: Self-pay | Admitting: Neurology

## 2024-07-03 ENCOUNTER — Ambulatory Visit: Admitting: Neurology

## 2024-07-03 VITALS — BP 118/69 | HR 78 | Ht 65.0 in | Wt 141.0 lb

## 2024-07-03 DIAGNOSIS — G1221 Amyotrophic lateral sclerosis: Secondary | ICD-10-CM

## 2024-07-03 DIAGNOSIS — M21371 Foot drop, right foot: Secondary | ICD-10-CM

## 2024-07-03 NOTE — Progress Notes (Signed)
 Follow-up Visit   Date: 07/03/2024    Kathleen Reed MRN: [id number] DOB: 18-Sep-1956    Kathleen Reed is a 68 y.o. right-handed Caucasian female with Hashimoto's thyroiditis, complete heart block s/p PPM (2023) returning to the clinic for follow-up of right foot drop.  The patient was accompanied to the clinic by husband who also provides collateral information.    IMPRESSION/PLAN: Clinically probable amyotrophic lateral sclerosis manifesting with progressive painless bilateral leg weakness, worse on the right where there is foot drop.  Nerve testing shows diffuse active on chronic changes in the lumbar region (L3-S1 nerve roots/segments) and predominately chronic changes in the cervical segment.  Imaging of the cervical and lumbar spine does not show compressive pathology.  Labs are normal, except mild elevation in CK which is expected.   On exam, there is asymmetric lower extremity weakness with right foot drop and hyperreflexia at the knee.  In the upper extremity, there is no weakness and reflexes are normal.  No fasciculations are seen.   She does not have bulbar weakness or symptoms.    I had a long discussion reviewing her testing and likelihood that symptoms are consistent with motor neuron disease and may represent ALS.  I discussed the diagnosis and briefly mentioned that there are two FDA-approved medications for ALS, which unfortunately do not provide a cure but can slow the progression of the disease. At this time, we did not start any medications.  I also recommend seeing ALS multidisciplinary clinic as a second opinion and learn about clinical trials that she may be a candidate for.   For her right foot drop, AFO has been recommended.  She has completed PT.  Return to clinic in 4 months, or sooner as needed  --------------------------------------------- History of present illness: Starting around 2024, she began having weakness in both legs, which is worse in the  right leg.  She has started to manually lift the right leg to get in an out of her SUV.  She endorses muscle twitches and cramps in the legs.  She completed PT with no improvement.  She saw Dr. Burnetta with orthopaedics for evaluation who order MRI lumbar spine which showed anterolisthesis at L4/L5 and small disc protrusion at L1-2.  She was subsequently referred for EMG which showed diffuse active on chronic motor axon loss changes affecting all the tested muscles, including the paraspinal muscles.  She is referred for further evaluation.   She denies weakness in the arm, difficulty with speech/swallow.     She has a long history of right buttocks pain which is worse when she was sleeping on that side.  Several years ago, she was evaluated by [facility] and had imaging which showed gluteal cyst which was unable to be successfully aspirated.    She lives at home with her husband.  She teaches line dancing to senior citizens.  Nonsmoker.  She rarely drinks alcohol.   No family history of neuromuscular weakness.   UPDATE 07/03/2024:  She is here for follow-up with accompanied by her husband.  Since she was here last, I ordered NCS/EMG of the arms, labs, and MRI cervical spine.  There is no compressive pathology on imaging and labs are normal, except mild elevation in CK.  She continues to have difficulty with walking.  Husband has noticed greater difficulty with climbing steps.  She denies weakness in the arms or problems with speech/swallow.  She endorses muscle cramps. No muscle twitches.   Medications:  Current Outpatient Medications on File Prior to Visit  Medication Sig Dispense Refill   cetirizine (ZYRTEC) 10 MG tablet Take 10 mg by mouth daily as needed (allergies).     Multiple Vitamins-Minerals (MULTIVITAMIN WITH MINERALS) tablet Take 1 tablet by mouth daily.     NALTREXONE HCL PO Take 3 mg by mouth at bedtime.     NONFORMULARY OR COMPOUNDED ITEM Apply 0.5 mLs topically daily.  BI-Estrogen Cream 10 mg/ mL     progesterone  (PROMETRIUM ) 100 MG capsule TAKE 1 CAPSULE BY MOUTH ONCE DAILY. 30 capsule 0   pseudoephedrine (SUDAFED) 30 MG tablet Take 60 mg by mouth every 8 (eight) hours as needed for congestion.     thyroid  (ARMOUR) 90 MG tablet Take 90 mg by mouth daily. (Patient taking differently: Take 75 mg by mouth daily.)     No current facility-administered medications on file prior to visit.    Allergies:  Allergies  Allergen Reactions   Demerol [Meperidine]     Caused increased blood pressure, redness in face   Amoxicillin Hives and Other (See Comments)   Augmentin [Amoxicillin-Pot Clavulanate] Hives   Vancomycin  Itching    ? Red man, pink scalp and ear itching    Penicillins Rash    Vital Signs:  BP 118/69   Pulse 78   Ht 5' 5 (1.651 m)   Wt 141 lb (64 kg)   SpO2 100%   BMI 23.46 kg/m    Neurological Exam: MENTAL STATUS including orientation to time, place, person, recent and remote memory, attention span and concentration, language, and fund of knowledge is normal.  Speech is not dysarthric.  CRANIAL NERVES:  Pupils equal round and reactive to light.  Normal conjugate, extra-ocular eye movements in all directions of gaze.  No ptosis.  Face is symmetric. Palate elevates symmetrically.  Tongue is midline, no fasciculations. Bilateral palmometal reflex is present.   Snout, jaw jerk, and Myerson's sign is absent.   MOTOR:  Mild loss of muscle bulk in the right ADM, mild right TA atrophy. No fasciculations or abnormal movements.  No pronator drift.   Upper Extremity:  Right  Left  Deltoid  5/5   5/5   Biceps  5/5   5/5   Triceps  5/5   5/5   Wrist extensors  5/5   5/5   Wrist flexors  5/5   5/5   Finger extensors  5/5   5/5   Finger flexors  5/5   5/5   Dorsal interossei  5/5   5/5   Abductor pollicis  5/5   5/5   Tone (Ashworth scale)  0  0   Lower Extremity:  Right  Left  Hip flexors  4/5   4+/5   Hip extensors  4/5   5/5   Adductor 3/5   5/5  Abductor 5/5  5/5  Knee flexors  4/5   5/5   Knee extensors  5-/5   5/5   Dorsiflexors  2/5   3/5   Plantarflexors  3/5   3/5   Toe extensors  2/5   3/5   Toe flexors  3/5   3/5   Tone (Ashworth scale)  0  0   MSRs:                                           Right  Left brachioradialis 2+  2+  biceps 2+  2+  triceps 2+  2+  patellar 3+  3+  ankle jerk 2+  2+  Hoffman no  no  plantar response down  down  Crossed adductors  SENSORY:  Intact to vibration throughout.  COORDINATION/GAIT:  Normal finger-to- nose-finger.  Intact finger tapping.  Slowed toe tapping, worse on the right.  Gait shows steppage on the right, stable, unassisted.   Data: Labs 05/22/2024:   ESR 9, CRP <3.0, CK 232*, heavy metal screen neg, vitamin B12 503, folate 23.6, copper , 116, PTH normal, SPEP with IFE no M protein, Lyme neg  MRI cervical spine wo contrast 06/23/2024: 1. Mild central spinal canal stenosis and mild bilateral neuroforaminal stenosis at C5-6 due to mild diffuse disc bulging and bilateral uncovertebral joint hypertrophy. 2. Mild central spinal canal stenosis and mild left neural foraminal stenosis at C6-7 due to mild diffuse disc bulging and left-sided uncovertebral joint hypertrophy.  NCS/EMG of the arms 06/28/2024: The electrophysiologic findings show chronic neurogenic changes involving bilateral C5, C6, C7, and right C8 nerve roots/segments with very sparse active changes.  MRI lumbar spine wo contrast 05/07/2024: 1. Mild chronic grade 1 anterolisthesis of L4 on L5 with severe facet arthropathy. Capacious spinal canal and no spinal stenosis.  Borderline to mild increased right lateral recess stenosis. Query right L5 radiculitis. 2. Small new left paracentral disc extrusion at L1-L2, directed toward the left lateral recess there but no associated stenosis. Query left L2 radiculitis. 3. L3-L4 mild to moderate facet and ligament flavum hypertrophy has progressed since 2021, but no  associated stenosis.   NCS/EMG of the legs 05/17/2024: The electrophysiologic findings are most consistent with an active on chronic multilevel intraspinal canal lesion process (i.e radiculopathy, anterior horn cell disorder, etc.) affecting the L3-S1 nerve roots/segments bilaterally, and worse on the right.  Recommend neurological consultation.    Total time spent reviewing records, interview, history/exam, documentation, and coordination of care on day of encounter:  50 minutes    Thank you for allowing me to participate in patient's care.  If I can answer any additional questions, I would be pleased to do so.    Sincerely,    Cyan Moultrie K. [name], DO '

## 2024-07-16 DIAGNOSIS — R252 Cramp and spasm: Secondary | ICD-10-CM | POA: Diagnosis not present

## 2024-07-16 DIAGNOSIS — Z0001 Encounter for general adult medical examination with abnormal findings: Secondary | ICD-10-CM | POA: Diagnosis not present

## 2024-07-16 DIAGNOSIS — E559 Vitamin D deficiency, unspecified: Secondary | ICD-10-CM | POA: Diagnosis not present

## 2024-07-16 DIAGNOSIS — J439 Emphysema, unspecified: Secondary | ICD-10-CM | POA: Diagnosis not present

## 2024-07-16 DIAGNOSIS — E782 Mixed hyperlipidemia: Secondary | ICD-10-CM | POA: Diagnosis not present

## 2024-07-16 DIAGNOSIS — E039 Hypothyroidism, unspecified: Secondary | ICD-10-CM | POA: Diagnosis not present

## 2024-07-16 DIAGNOSIS — M545 Low back pain, unspecified: Secondary | ICD-10-CM | POA: Diagnosis not present

## 2024-07-16 DIAGNOSIS — G1221 Amyotrophic lateral sclerosis: Secondary | ICD-10-CM | POA: Diagnosis not present

## 2024-07-16 DIAGNOSIS — I4460 Unspecified fascicular block: Secondary | ICD-10-CM | POA: Diagnosis not present

## 2024-07-23 DIAGNOSIS — E039 Hypothyroidism, unspecified: Secondary | ICD-10-CM | POA: Diagnosis not present

## 2024-07-25 ENCOUNTER — Encounter: Payer: Self-pay | Admitting: Internal Medicine

## 2024-07-25 ENCOUNTER — Ambulatory Visit (INDEPENDENT_AMBULATORY_CARE_PROVIDER_SITE_OTHER)

## 2024-07-25 DIAGNOSIS — I442 Atrioventricular block, complete: Secondary | ICD-10-CM

## 2024-07-25 DIAGNOSIS — G1221 Amyotrophic lateral sclerosis: Secondary | ICD-10-CM | POA: Diagnosis not present

## 2024-07-26 ENCOUNTER — Ambulatory Visit: Payer: Self-pay | Admitting: Cardiology

## 2024-07-26 LAB — CUP PACEART REMOTE DEVICE CHECK
Battery Remaining Longevity: 150 mo
Battery Voltage: 3.01 V
Brady Statistic AP VP Percent: 2.52 %
Brady Statistic AP VS Percent: 0.29 %
Brady Statistic AS VP Percent: 0.01 %
Brady Statistic AS VS Percent: 97.18 %
Brady Statistic RA Percent Paced: 2.81 %
Brady Statistic RV Percent Paced: 2.53 %
Date Time Interrogation Session: 20250903192747
Implantable Lead Connection Status: 753985
Implantable Lead Connection Status: 753985
Implantable Lead Implant Date: 20221207
Implantable Lead Implant Date: 20221207
Implantable Lead Location: 753859
Implantable Lead Location: 753860
Implantable Lead Model: 3830
Implantable Lead Model: 5076
Implantable Pulse Generator Implant Date: 20221207
Lead Channel Impedance Value: 304 Ohm
Lead Channel Impedance Value: 361 Ohm
Lead Channel Impedance Value: 361 Ohm
Lead Channel Impedance Value: 475 Ohm
Lead Channel Pacing Threshold Amplitude: 0.5 V
Lead Channel Pacing Threshold Amplitude: 1 V
Lead Channel Pacing Threshold Pulse Width: 0.4 ms
Lead Channel Pacing Threshold Pulse Width: 0.4 ms
Lead Channel Sensing Intrinsic Amplitude: 2.75 mV
Lead Channel Sensing Intrinsic Amplitude: 2.75 mV
Lead Channel Sensing Intrinsic Amplitude: 5.625 mV
Lead Channel Sensing Intrinsic Amplitude: 5.625 mV
Lead Channel Setting Pacing Amplitude: 2 V
Lead Channel Setting Pacing Amplitude: 2.5 V
Lead Channel Setting Pacing Pulse Width: 0.4 ms
Lead Channel Setting Sensing Sensitivity: 0.9 mV
Zone Setting Status: 755011
Zone Setting Status: 755011

## 2024-08-01 ENCOUNTER — Encounter: Admitting: Neurology

## 2024-08-03 NOTE — Progress Notes (Signed)
 Remote PPM Transmission

## 2024-08-09 ENCOUNTER — Ambulatory Visit: Admitting: Sports Medicine

## 2024-08-09 ENCOUNTER — Encounter: Payer: Self-pay | Admitting: Student

## 2024-08-09 ENCOUNTER — Ambulatory Visit: Attending: Student | Admitting: Cardiology

## 2024-08-09 VITALS — BP 126/88 | HR 74 | Ht 65.0 in | Wt 137.2 lb

## 2024-08-09 DIAGNOSIS — I447 Left bundle-branch block, unspecified: Secondary | ICD-10-CM | POA: Diagnosis not present

## 2024-08-09 DIAGNOSIS — I442 Atrioventricular block, complete: Secondary | ICD-10-CM | POA: Diagnosis not present

## 2024-08-09 LAB — CUP PACEART INCLINIC DEVICE CHECK
Battery Remaining Longevity: 149 mo
Battery Voltage: 3.01 V
Brady Statistic AP VP Percent: 1.87 %
Brady Statistic AP VS Percent: 0.19 %
Brady Statistic AS VP Percent: 0.01 %
Brady Statistic AS VS Percent: 97.93 %
Brady Statistic RA Percent Paced: 2.09 %
Brady Statistic RV Percent Paced: 1.88 %
Date Time Interrogation Session: 20250919122511
Implantable Lead Connection Status: 753985
Implantable Lead Connection Status: 753985
Implantable Lead Implant Date: 20221207
Implantable Lead Implant Date: 20221207
Implantable Lead Location: 753859
Implantable Lead Location: 753860
Implantable Lead Model: 3830
Implantable Lead Model: 5076
Implantable Pulse Generator Implant Date: 20221207
Lead Channel Impedance Value: 304 Ohm
Lead Channel Impedance Value: 361 Ohm
Lead Channel Impedance Value: 380 Ohm
Lead Channel Impedance Value: 513 Ohm
Lead Channel Pacing Threshold Amplitude: 0.5 V
Lead Channel Pacing Threshold Amplitude: 1 V
Lead Channel Pacing Threshold Pulse Width: 0.4 ms
Lead Channel Pacing Threshold Pulse Width: 0.4 ms
Lead Channel Sensing Intrinsic Amplitude: 2 mV
Lead Channel Sensing Intrinsic Amplitude: 2.875 mV
Lead Channel Sensing Intrinsic Amplitude: 5.75 mV
Lead Channel Sensing Intrinsic Amplitude: 6.5 mV
Lead Channel Setting Pacing Amplitude: 2 V
Lead Channel Setting Pacing Amplitude: 2.5 V
Lead Channel Setting Pacing Pulse Width: 0.6 ms
Lead Channel Setting Sensing Sensitivity: 0.9 mV
Zone Setting Status: 755011
Zone Setting Status: 755011

## 2024-08-09 NOTE — Progress Notes (Signed)
  Electrophysiology Office Note:   ID:  Kathleen, Reed 1956-02-25, MRN [id number]  Primary Cardiologist: None Electrophysiologist: Will Gladis Norton, MD      History of Present Illness:   Kathleen Reed is a 68 y.o. female with h/o LBBB, CHB seen today for routine electrophysiology followup.   Since last being seen in our clinic the patient reports doing well from a cardiovascular standpoint.  she denies chest pain, palpitations, dyspnea, PND, dizziness, syncope, edema. Patient does report significant health related stress due to recent ALS diagnosis. She has ongoing workup planned with [facility].   Review of systems complete and found to be negative unless listed in HPI.   EP Information / Studies Reviewed:    EKG is ordered today. Personal review as below.  EKG Interpretation Date/Time:  Friday August 09 2024 11:24:01 EDT Ventricular Rate:  74 PR Interval:  140 QRS Duration:  148 QT Interval:  456 QTC Calculation: 506 R Axis:   -60  Text Interpretation: Normal sinus rhythm Left axis deviation Left bundle branch block When compared with ECG of 01-Aug-2023 14:19, No significant change was found Confirmed by Trudy [place] [phone]) on 08/09/2024 11:52:21 AM    PPM Interrogation-  reviewed in detail today,  See PACEART report.  Arrhythmia/Device History [id number] Carelink SK-EXPLANTED CARELINK PPM MEDTRONIC-SK   Physical Exam:   VS:  BP 126/88 (BP Location: Left Arm, Patient Position: Sitting, Cuff Size: Normal)   Pulse 74   Ht 5' 5 (1.651 m)   Wt 137 lb 3.2 oz (62.2 kg)   SpO2 97%   BMI 22.83 kg/m    Wt Readings from Last 3 Encounters:  08/09/24 137 lb 3.2 oz (62.2 kg)  07/03/24 141 lb (64 kg)  05/22/24 140 lb (63.5 kg)     GEN: No acute distress  NECK: No JVD CARDIAC: Regular rate and rhythm, no murmurs, rubs, gallops RESPIRATORY:  Clear to auscultation without rales, wheezing or rhonchi  ABDOMEN: Soft, non-tender,  non-distended EXTREMITIES:  No edema; No deformity   ASSESSMENT AND PLAN:    CHB s/p Medtronic PPM  Normal PPM function See Pace Art report No changes today  LBBB ECG unchanged today with LBBB (native conduction, minimal pacing burden). If pacing burden rises significantly, could consider CRT upgrade.   Syncope No recurrent episodes s/p PPM.     Disposition:   Follow up with Dr. Norton 24 months   Signed, [place] Trudy, PA-C

## 2024-08-09 NOTE — Patient Instructions (Addendum)
 Medication Instructions:  Your physician recommends that you continue on your current medications as directed. Please refer to the Current Medication list given to you today.  *If you need a refill on your cardiac medications before your next appointment, please call your pharmacy*  Lab Work: NONE ordered at this time of appointment   Testing/Procedures: NONE ordered at this time of appointment   Follow-Up: At [facility], you and your health needs are our priority.  As part of our continuing mission to provide you with exceptional heart care, our providers are all part of one team.  This team includes your primary Cardiologist (physician) and Advanced Practice Providers or APPs (Physician Assistants and Nurse Practitioners) who all work together to provide you with the care you need, when you need it.  Your next appointment:   2 year(s)  Provider:   Dr. Inocencio  We recommend signing up for the patient portal called MyChart.  Sign up information is provided on this After Visit Summary.  MyChart is used to connect with patients for Virtual Visits (Telemedicine).  Patients are able to view lab/test results, encounter notes, upcoming appointments, etc.  Non-urgent messages can be sent to your provider as well.   To learn more about what you can do with MyChart, go to [email or web address].

## 2024-08-12 ENCOUNTER — Ambulatory Visit: Admitting: Sports Medicine

## 2024-08-12 ENCOUNTER — Encounter: Payer: Self-pay | Admitting: Sports Medicine

## 2024-08-12 ENCOUNTER — Ambulatory Visit: Payer: Self-pay | Admitting: Cardiology

## 2024-08-12 DIAGNOSIS — M67951 Unspecified disorder of synovium and tendon, right thigh: Secondary | ICD-10-CM | POA: Diagnosis not present

## 2024-08-12 DIAGNOSIS — M7918 Myalgia, other site: Secondary | ICD-10-CM

## 2024-08-12 DIAGNOSIS — G1221 Amyotrophic lateral sclerosis: Secondary | ICD-10-CM | POA: Diagnosis not present

## 2024-08-12 DIAGNOSIS — R29898 Other symptoms and signs involving the musculoskeletal system: Secondary | ICD-10-CM | POA: Diagnosis not present
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
## 2024-08-12 NOTE — Progress Notes (Signed)
 Patient says that she was recently diagnosed with ALS. She is here today as she is still having localized pain in the right glute. She says that she had this pain evaluated when it began after a fall in 2021. She is unable to sleep due to the pain, as she cannot lay on her right side. She does not have pain when laying on her back but has trouble breathing, and cannot lay on her left side for long due to soreness from her pacemaker. She is inquiring about any possible treatments to alleviate her pain and allow her to sleep.

## 2024-08-13 ENCOUNTER — Ambulatory Visit: Admitting: Neurology

## 2024-08-21 DIAGNOSIS — G1221 Amyotrophic lateral sclerosis: Secondary | ICD-10-CM | POA: Diagnosis not present

## 2024-08-27 ENCOUNTER — Ambulatory Visit: Admitting: Sports Medicine

## 2024-08-27 DIAGNOSIS — M67951 Unspecified disorder of synovium and tendon, right thigh: Secondary | ICD-10-CM | POA: Diagnosis not present

## 2024-08-27 DIAGNOSIS — R29898 Other symptoms and signs involving the musculoskeletal system: Secondary | ICD-10-CM

## 2024-08-27 DIAGNOSIS — M7918 Myalgia, other site: Secondary | ICD-10-CM

## 2024-08-27 DIAGNOSIS — M62838 Other muscle spasm: Secondary | ICD-10-CM

## 2024-08-27 DIAGNOSIS — G1221 Amyotrophic lateral sclerosis: Secondary | ICD-10-CM | POA: Diagnosis not present

## 2024-08-27 MED ORDER — CYCLOBENZAPRINE HCL 10 MG PO TABS: 10.0000 mg | ORAL_TABLET | Freq: Every day | ORAL | 1 refills | Status: DC
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
## 2024-08-27 NOTE — Progress Notes (Unsigned)
 Patient says that she was sore for a few days after her first shockwave treatment, and otherwise has not noticed much of a change. She is inquiring about the cause of her pain, and whether treatment will help as she has had various other treatments in the past that did not give her relief.

## 2024-09-03 ENCOUNTER — Ambulatory Visit: Admitting: Sports Medicine

## 2024-09-03 ENCOUNTER — Encounter: Payer: Self-pay | Admitting: Sports Medicine

## 2024-09-03 DIAGNOSIS — G1221 Amyotrophic lateral sclerosis: Secondary | ICD-10-CM | POA: Diagnosis not present

## 2024-09-03 DIAGNOSIS — M67951 Unspecified disorder of synovium and tendon, right thigh: Secondary | ICD-10-CM

## 2024-09-03 DIAGNOSIS — M7918 Myalgia, other site: Secondary | ICD-10-CM
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
## 2024-09-03 NOTE — Progress Notes (Signed)
 Patient says that she felt pretty good for a few days after her last shockwave treatment. She has seen a decrease in that improvement in the last couple of days, but still feels her pain is about 60% improved overall. She has felt that her weakness and trouble with balance have gotten worse in the last couple of days, as well.

## 2024-09-05 NOTE — CV Procedure (Signed)
  Device system confirmed to be MRI conditional, with implant date > 6 weeks ago, and no evidence of abandoned or epicardial leads in review of most recent CXR  Device last cleared by EP Provider: Charlies Arthur 09/05/24  Clearance is good through for 1 year as long as parameters remain stable at time of check. If pt undergoes a cardiac device procedure during that time, they should be re-cleared.   Tachy-therapies to be programmed off if applicable with device back to pre-MRI settings after completion of exam.  [employer] - Programming recommendation received through [employer] App/Tablet  Rocky Catalan, RT  09/05/2024 3:06 PM

## 2024-09-10 ENCOUNTER — Ambulatory Visit (HOSPITAL_COMMUNITY)
Admission: RE | Admit: 2024-09-10 | Discharge: 2024-09-10 | Disposition: A | Discharge status: Home / Self Care | Source: Ambulatory Visit | Attending: Sports Medicine | Admitting: Sports Medicine

## 2024-09-10 DIAGNOSIS — M7918 Myalgia, other site: Secondary | ICD-10-CM | POA: Insufficient documentation

## 2024-09-10 DIAGNOSIS — M25551 Pain in right hip: Secondary | ICD-10-CM | POA: Diagnosis not present

## 2024-09-10 DIAGNOSIS — M67951 Unspecified disorder of synovium and tendon, right thigh: Secondary | ICD-10-CM | POA: Diagnosis present

## 2024-09-10 DIAGNOSIS — R29898 Other symptoms and signs involving the musculoskeletal system: Secondary | ICD-10-CM | POA: Diagnosis not present

## 2024-09-10 NOTE — Progress Notes (Signed)
 Patient was monitored by this RN during MRI scan due to presence of a pacemaker. Cardiac rhythm was continuously monitored throughout the procedure. Prior to the start of the scan, the pacemaker was placed in MRI-safe mode by the MRI technician. Following the completion of the scan, the device was returned to its pre-MRI settings. Neurological status and orientation post-procedure were unchanged from baseline.   Pre-procedure Heart Rate (Prior to being placed in MRI safe mode): 71 Intra-MRI HR: 85 Post-procedure Heart Rate (Once pacemaker is returned to baseline mode): 70

## 2024-09-15 ENCOUNTER — Encounter

## 2024-09-17 ENCOUNTER — Ambulatory Visit: Admitting: Sports Medicine

## 2024-09-17 ENCOUNTER — Encounter: Payer: Self-pay | Admitting: Sports Medicine

## 2024-09-17 DIAGNOSIS — M6258 Muscle wasting and atrophy, not elsewhere classified, other site: Secondary | ICD-10-CM | POA: Diagnosis not present

## 2024-09-17 DIAGNOSIS — G1221 Amyotrophic lateral sclerosis: Secondary | ICD-10-CM

## 2024-09-17 DIAGNOSIS — M67951 Unspecified disorder of synovium and tendon, right thigh: Secondary | ICD-10-CM

## 2024-09-17 DIAGNOSIS — R29898 Other symptoms and signs involving the musculoskeletal system: Secondary | ICD-10-CM | POA: Diagnosis not present

## 2024-09-17 NOTE — Progress Notes (Signed)
 Patient says that she felt better for a couple of days after the last shockwave treatment, but her pain did return with the same intensity as it was prior to the treatment. She is not interested in repeating shockwave therapy today. She is here for MRI review.
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
## 2024-09-18 DIAGNOSIS — E039 Hypothyroidism, unspecified: Secondary | ICD-10-CM | POA: Diagnosis not present

## 2024-09-23 ENCOUNTER — Encounter: Payer: Self-pay | Admitting: Radiology

## 2024-10-09 DIAGNOSIS — M62830 Muscle spasm of back: Secondary | ICD-10-CM | POA: Diagnosis not present

## 2024-10-09 DIAGNOSIS — M9902 Segmental and somatic dysfunction of thoracic region: Secondary | ICD-10-CM | POA: Diagnosis not present

## 2024-10-09 DIAGNOSIS — M9903 Segmental and somatic dysfunction of lumbar region: Secondary | ICD-10-CM | POA: Diagnosis not present

## 2024-10-09 DIAGNOSIS — M546 Pain in thoracic spine: Secondary | ICD-10-CM | POA: Diagnosis not present

## 2024-10-09 DIAGNOSIS — M9905 Segmental and somatic dysfunction of pelvic region: Secondary | ICD-10-CM | POA: Diagnosis not present

## 2024-10-15 ENCOUNTER — Telehealth: Payer: Self-pay | Admitting: Neurology

## 2024-10-15 DIAGNOSIS — M62830 Muscle spasm of back: Secondary | ICD-10-CM | POA: Diagnosis not present

## 2024-10-15 DIAGNOSIS — M9903 Segmental and somatic dysfunction of lumbar region: Secondary | ICD-10-CM | POA: Diagnosis not present

## 2024-10-15 DIAGNOSIS — M9905 Segmental and somatic dysfunction of pelvic region: Secondary | ICD-10-CM | POA: Diagnosis not present

## 2024-10-15 DIAGNOSIS — M9902 Segmental and somatic dysfunction of thoracic region: Secondary | ICD-10-CM | POA: Diagnosis not present

## 2024-10-15 DIAGNOSIS — M546 Pain in thoracic spine: Secondary | ICD-10-CM | POA: Diagnosis not present

## 2024-10-15 NOTE — Telephone Encounter (Signed)
 Pt called to see if she needs to see [name] and [facility]. She has been seeing the ALS clinic. She has an appt 10/28/24 w/Patel. She really loves Dr.Patel and appreciates everything she has done for her. She is just confused and doesn't know protocol. Keep both neuro or just one?

## 2024-10-15 NOTE — Telephone Encounter (Signed)
 It would be okay for her to continue at the ALS Clinic since they are starting medication and enrolling her in clinical trial.  Please let her know that I am here to assist her, as needed.  It would be okay to cancel her upcoming appointment, as long as she is okay with that.

## 2024-10-15 NOTE — Telephone Encounter (Signed)
 Called patient and left a detailed message per DPR of Dr. Anthony response. Left message letting patient know she may call the front to cancel the appointment if she would like to. Left contact information incase patient had any questions or concerns.

## 2024-10-21 DIAGNOSIS — M9902 Segmental and somatic dysfunction of thoracic region: Secondary | ICD-10-CM | POA: Diagnosis not present

## 2024-10-21 DIAGNOSIS — M9905 Segmental and somatic dysfunction of pelvic region: Secondary | ICD-10-CM | POA: Diagnosis not present

## 2024-10-21 DIAGNOSIS — M9903 Segmental and somatic dysfunction of lumbar region: Secondary | ICD-10-CM | POA: Diagnosis not present

## 2024-10-21 DIAGNOSIS — M546 Pain in thoracic spine: Secondary | ICD-10-CM | POA: Diagnosis not present

## 2024-10-21 DIAGNOSIS — M62830 Muscle spasm of back: Secondary | ICD-10-CM | POA: Diagnosis not present

## 2024-10-24 ENCOUNTER — Ambulatory Visit

## 2024-10-24 DIAGNOSIS — I442 Atrioventricular block, complete: Secondary | ICD-10-CM | POA: Diagnosis not present

## 2024-10-26 LAB — CUP PACEART REMOTE DEVICE CHECK
Battery Remaining Longevity: 146 mo
Battery Voltage: 3.01 V
Brady Statistic AP VP Percent: 0.47 %
Brady Statistic AP VS Percent: 0.04 %
Brady Statistic AS VP Percent: 0 %
Brady Statistic AS VS Percent: 99.49 %
Brady Statistic RA Percent Paced: 0.51 %
Brady Statistic RV Percent Paced: 0.47 %
Date Time Interrogation Session: 20251203212626
Implantable Lead Connection Status: 753985
Implantable Lead Connection Status: 753985
Implantable Lead Implant Date: 20221207
Implantable Lead Implant Date: 20221207
Implantable Lead Location: 753859
Implantable Lead Location: 753860
Implantable Lead Model: 3830
Implantable Lead Model: 5076
Implantable Pulse Generator Implant Date: 20221207
Lead Channel Impedance Value: 266 Ohm
Lead Channel Impedance Value: 342 Ohm
Lead Channel Impedance Value: 342 Ohm
Lead Channel Impedance Value: 456 Ohm
Lead Channel Pacing Threshold Amplitude: 0.5 V
Lead Channel Pacing Threshold Amplitude: 1 V
Lead Channel Pacing Threshold Pulse Width: 0.4 ms
Lead Channel Pacing Threshold Pulse Width: 0.4 ms
Lead Channel Sensing Intrinsic Amplitude: 2 mV
Lead Channel Sensing Intrinsic Amplitude: 2 mV
Lead Channel Sensing Intrinsic Amplitude: 6 mV
Lead Channel Sensing Intrinsic Amplitude: 6 mV
Lead Channel Setting Pacing Amplitude: 2 V
Lead Channel Setting Pacing Amplitude: 2.5 V
Lead Channel Setting Pacing Pulse Width: 0.6 ms
Lead Channel Setting Sensing Sensitivity: 0.9 mV
Zone Setting Status: 755011
Zone Setting Status: 755011

## 2024-10-28 ENCOUNTER — Ambulatory Visit: Admitting: Neurology

## 2024-10-29 NOTE — Progress Notes (Signed)
 Remote PPM Transmission

## 2024-10-30 ENCOUNTER — Ambulatory Visit: Payer: Self-pay | Admitting: Cardiology

## 2024-11-25 ENCOUNTER — Other Ambulatory Visit: Payer: Self-pay | Admitting: Sports Medicine

## 2025-01-14 ENCOUNTER — Ambulatory Visit: Payer: Self-pay | Admitting: Neurology

## 2025-01-23 ENCOUNTER — Ambulatory Visit

## 2025-04-24 ENCOUNTER — Ambulatory Visit

## 2025-07-24 ENCOUNTER — Ambulatory Visit
